# Patient Record
Sex: Male | Born: 1995 | Race: White | Hispanic: No | Marital: Married | Smoking: Current some day smoker
Health system: Southern US, Community
[De-identification: ages and names within clinical notes are randomized; demographics above are authoritative.]

## PROBLEM LIST (undated history)

## (undated) DIAGNOSIS — F319 Bipolar disorder, unspecified: Secondary | ICD-10-CM

## (undated) DIAGNOSIS — F419 Anxiety disorder, unspecified: Secondary | ICD-10-CM

## (undated) DIAGNOSIS — F909 Attention-deficit hyperactivity disorder, unspecified type: Secondary | ICD-10-CM

## (undated) HISTORY — DX: Bipolar disorder, unspecified: F31.9

## (undated) HISTORY — PX: OTHER SURGICAL HISTORY: SHX169

## (undated) HISTORY — PX: TONSILLECTOMY: SUR1361

## (undated) HISTORY — DX: Attention-deficit hyperactivity disorder, unspecified type: F90.9

## (undated) HISTORY — DX: Anxiety disorder, unspecified: F41.9

## (undated) HISTORY — PX: CIRCUMCISION: SUR203

---
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
## 2014-05-16 ENCOUNTER — Encounter (HOSPITAL_COMMUNITY): Payer: Self-pay | Admitting: *Deleted

## 2014-05-16 ENCOUNTER — Emergency Department (HOSPITAL_COMMUNITY)
Admission: EM | Admit: 2014-05-16 | Discharge: 2014-05-16 | Disposition: A | Discharge status: Home / Self Care | Payer: BLUE CROSS/BLUE SHIELD | Attending: Emergency Medicine | Admitting: Emergency Medicine

## 2014-05-16 DIAGNOSIS — Z720 Tobacco use: Secondary | ICD-10-CM | POA: Diagnosis not present

## 2014-05-16 DIAGNOSIS — N4889 Other specified disorders of penis: Secondary | ICD-10-CM | POA: Diagnosis present

## 2014-05-16 DIAGNOSIS — F1410 Cocaine abuse, uncomplicated: Secondary | ICD-10-CM | POA: Diagnosis not present

## 2014-05-16 NOTE — ED Provider Notes (Signed)
CSN: [id number]     Arrival date & time 05/16/14  0225 History   First MD Initiated Contact with Patient 05/16/14 0340     Chief Complaint  Patient presents with  . penile issues      (Consider location/radiation/quality/duration/timing/severity/associated sxs/prior Treatment) The history is provided by the patient.   19 year old male states that he used cocaine tonight and shortly after that, noted swelling in his penis. He states that it was our card and there was some discharge. It was not painful but stated that it felt weird. He states it was not a normal direction. It has improved substantially but he still feels somewhat swollen. He denies any trauma. He has used cocaine before without a similar reaction.  History reviewed. No pertinent past medical history. Past Surgical History  Procedure Laterality Date  . Tonsillectomy    . Arm surgery Right   . Circumcision     History reviewed. No pertinent family history. History  Substance Use Topics  . Smoking status: Current Some Day Smoker -- 0.50 packs/day  . Smokeless tobacco: Not on file  . Alcohol Use: No    Review of Systems  All other systems reviewed and are negative.     Allergies  Review of patient's allergies indicates no known allergies.  Home Medications   Prior to Admission medications   Not on File   BP 132/74 mmHg  Pulse 119  Temp(Src) 98.5 F (36.9 C) (Oral)  Resp 20  Ht 6' (1.829 m)  Wt 145 lb (65.772 kg)  BMI 19.66 kg/m2  SpO2 100% Physical Exam  Nursing note and vitals reviewed.  19 year old male, resting comfortably and in no acute distress. Vital signs are significant for tachycardia. Oxygen saturation is 100%, which is normal. Head is normocephalic and atraumatic. PERRLA, EOMI. Oropharynx is clear. Neck is nontender and supple without adenopathy or JVD. Back is nontender and there is no CVA tenderness. Lungs are clear without rales, wheezes, or rhonchi. Chest is nontender. Heart has  regular rate and rhythm without murmur. Abdomen is soft, flat, nontender without masses or hepatosplenomegaly and peristalsis is normoactive. Genitalia: Circumcised penis which does not appear or act. Testes are descended without masses. There is shotty inguinal adenopathy bilaterally. No urethral discharges seen. Extremities have no cyanosis or edema, full range of motion is present. Skin is warm and dry without rash. Neurologic: Mental status is normal, cranial nerves are intact, there are no motor or sensory deficits.  ED Course  Procedures (including critical care time)  MDM   Final diagnoses:  Cocaine abuse    Probable abnormal erection which has resolved. Cocaine abuse. Patient is reassured and is referred to urology for follow-up. Encouraged to abstain from cocaine.    Dione Boozeavid Makaiya Geerdes, MD 05/16/14 [phone]

## 2014-05-16 NOTE — ED Notes (Signed)
Pt states he noticed his penis was hard x 1 hour; pt states it feels weird

## 2014-05-16 NOTE — Discharge Instructions (Signed)
Do Not Use Cocaine!!

## 2014-05-16 NOTE — ED Notes (Signed)
Pt states he did cocaine this evening approximately 2 hrs pta

## 2016-11-25 ENCOUNTER — Emergency Department (HOSPITAL_COMMUNITY)
Admission: EM | Admit: 2016-11-25 | Discharge: 2016-11-25 | Disposition: A | Discharge status: Home / Self Care | Payer: BLUE CROSS/BLUE SHIELD | Attending: Emergency Medicine | Admitting: Emergency Medicine

## 2016-11-25 ENCOUNTER — Encounter (HOSPITAL_COMMUNITY): Payer: Self-pay | Admitting: *Deleted

## 2016-11-25 DIAGNOSIS — M79601 Pain in right arm: Secondary | ICD-10-CM | POA: Insufficient documentation

## 2016-11-25 DIAGNOSIS — Y929 Unspecified place or not applicable: Secondary | ICD-10-CM | POA: Insufficient documentation

## 2016-11-25 DIAGNOSIS — S40021A Contusion of right upper arm, initial encounter: Secondary | ICD-10-CM | POA: Insufficient documentation

## 2016-11-25 DIAGNOSIS — Y9389 Activity, other specified: Secondary | ICD-10-CM | POA: Insufficient documentation

## 2016-11-25 DIAGNOSIS — Y999 Unspecified external cause status: Secondary | ICD-10-CM | POA: Insufficient documentation

## 2016-11-25 DIAGNOSIS — F17200 Nicotine dependence, unspecified, uncomplicated: Secondary | ICD-10-CM | POA: Insufficient documentation

## 2016-11-25 MED ORDER — IBUPROFEN 800 MG PO TABS: 800.0000 mg | ORAL_TABLET | Freq: Three times a day (TID) | ORAL | 0 refills | Status: DC

## 2016-11-25 MED ORDER — IBUPROFEN 800 MG PO TABS
800.0000 mg | ORAL_TABLET | Freq: Once | ORAL | Status: AC
  Administered 2016-11-25: 800 mg via ORAL
  Filled 2016-11-25: qty 1
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
## 2016-11-25 NOTE — Discharge Instructions (Signed)
Ibuprofen every 8 hours as needed, he may use the sling for the next couple of days however I would try to use this arm as much as possible and do gentle stretching of your bicep and tricep muscles.  ER for severe or worsening weakness numbness pain or swelling.
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
## 2022-05-25 ENCOUNTER — Encounter: Payer: Self-pay | Admitting: Internal Medicine

## 2022-05-25 ENCOUNTER — Ambulatory Visit (INDEPENDENT_AMBULATORY_CARE_PROVIDER_SITE_OTHER): Payer: 59 | Admitting: Internal Medicine

## 2022-05-25 VITALS — BP 133/82 | HR 86 | Ht 73.0 in | Wt 213.0 lb

## 2022-05-25 DIAGNOSIS — Z131 Encounter for screening for diabetes mellitus: Secondary | ICD-10-CM | POA: Diagnosis not present

## 2022-05-25 DIAGNOSIS — Z1321 Encounter for screening for nutritional disorder: Secondary | ICD-10-CM

## 2022-05-25 DIAGNOSIS — Z0001 Encounter for general adult medical examination with abnormal findings: Secondary | ICD-10-CM | POA: Diagnosis not present

## 2022-05-25 DIAGNOSIS — Z1159 Encounter for screening for other viral diseases: Secondary | ICD-10-CM

## 2022-05-25 DIAGNOSIS — Z8659 Personal history of other mental and behavioral disorders: Secondary | ICD-10-CM

## 2022-05-25 DIAGNOSIS — G4700 Insomnia, unspecified: Secondary | ICD-10-CM

## 2022-05-25 DIAGNOSIS — Z2821 Immunization not carried out because of patient refusal: Secondary | ICD-10-CM

## 2022-05-25 DIAGNOSIS — Z13220 Encounter for screening for lipoid disorders: Secondary | ICD-10-CM

## 2022-05-25 DIAGNOSIS — Z1329 Encounter for screening for other suspected endocrine disorder: Secondary | ICD-10-CM

## 2022-05-25 DIAGNOSIS — K219 Gastro-esophageal reflux disease without esophagitis: Secondary | ICD-10-CM

## 2022-05-25 DIAGNOSIS — Z114 Encounter for screening for human immunodeficiency virus [HIV]: Secondary | ICD-10-CM

## 2022-05-25 MED ORDER — FAMOTIDINE 40 MG PO TABS: 40.0000 mg | ORAL_TABLET | Freq: Two times a day (BID) | ORAL | 1 refills | Status: DC

## 2022-05-25 NOTE — Patient Instructions (Signed)
It was a pleasure to see you today.  Thank you for giving [place] the opportunity to be involved in your care.  Below is a brief recap of your visit and next steps.  We will plan to see you again in 6 months.  Summary You have established care today. We will check labs and request records from [place] / [place] Follow up in 6 months

## 2022-05-25 NOTE — Progress Notes (Signed)
New Patient Office Visit  Subjective    Patient ID: Shawn Matthews, male    DOB: 1995-12-21  Age: 27 y.o. MRN: [id number]  CC:  Chief Complaint  Patient presents with   Establish Care   HPI Shawn Matthews presents to establish care.  He is a 27 year old male who endorses a past medical history significant for ADHD, insomnia, mental health disorder, and GERD.  He has most recently received primary care in [place], [place].  He has also been followed by psychiatry in [place].  Shawn Matthews reports feeling well today.  He is asymptomatic and his acute concern is requesting a referral to psychiatry to establish care for management of ADHD and mental health.  Acute concerns, chronic medical conditions, and outstanding preventative care items discussed today are individually addressed in A/P below.  Outpatient Encounter Medications as of 05/25/2022  Medication Sig   amphetamine-dextroamphetamine (ADDERALL XR) 20 MG 24 hr capsule Take 20 mg by mouth every morning.   ibuprofen (ADVIL,MOTRIN) 800 MG tablet Take 1 tablet (800 mg total) by mouth 3 (three) times daily.   QUEtiapine (SEROQUEL) 100 MG tablet Take 100 mg by mouth at bedtime.   [DISCONTINUED] famotidine (PEPCID) 40 MG tablet Take 40 mg by mouth 2 (two) times daily.   famotidine (PEPCID) 40 MG tablet Take 1 tablet (40 mg total) by mouth 2 (two) times daily.   No facility-administered encounter medications on file as of 05/25/2022.    History reviewed. No pertinent past medical history.  Past Surgical History:  Procedure Laterality Date   arm surgery Right    CIRCUMCISION     TONSILLECTOMY      History reviewed. No pertinent family history.  Social History   Socioeconomic History   Marital status: Single    Spouse name: Not on file   Number of children: Not on file   Years of education: Not on file   Highest education level: Not on file  Occupational History   Not on file  Tobacco Use   Smoking status: Some Days    Packs/day: 0.50     Types: Cigarettes   Smokeless tobacco: Never  Vaping Use   Vaping Use: Never used  Substance and Sexual Activity   Alcohol use: No   Drug use: Yes    Types: Marijuana   Sexual activity: Yes    Birth control/protection: None  Other Topics Concern   Not on file  Social History Narrative   Not on file   Social Determinants of Health   Financial Resource Strain: Not on file  Food Insecurity: Not on file  Transportation Needs: Not on file  Physical Activity: Not on file  Stress: Not on file  Social Connections: Not on file  Intimate Partner Violence: Not on file   Review of Systems  Constitutional:  Negative for chills and fever.  HENT:  Negative for sore throat.   Respiratory:  Negative for cough and shortness of breath.   Cardiovascular:  Negative for chest pain, palpitations and leg swelling.  Gastrointestinal:  Negative for abdominal pain, blood in stool, constipation, diarrhea, nausea and vomiting.  Genitourinary:  Negative for dysuria and hematuria.  Musculoskeletal:  Negative for myalgias.  Skin:  Negative for itching and rash.  Neurological:  Negative for dizziness and headaches.  Psychiatric/Behavioral:  Negative for depression and suicidal ideas.    Objective    BP 133/82   Pulse 86   Ht '6\' 1"'$  (1.854 m)   Wt 213 lb (  96.6 kg)   SpO2 98%   BMI 28.10 kg/m   Physical Exam Vitals reviewed.  Constitutional:      General: He is not in acute distress.    Appearance: Normal appearance. He is not ill-appearing.  HENT:     Head: Normocephalic and atraumatic.     Right Ear: External ear normal.     Left Ear: External ear normal.     Nose: Nose normal. No congestion or rhinorrhea.     Mouth/Throat:     Mouth: Mucous membranes are moist.     Pharynx: Oropharynx is clear.  Eyes:     General: No scleral icterus.    Extraocular Movements: Extraocular movements intact.     Conjunctiva/sclera: Conjunctivae normal.     Pupils: Pupils are equal, round, and reactive  to light.  Cardiovascular:     Rate and Rhythm: Normal rate and regular rhythm.     Pulses: Normal pulses.     Heart sounds: Normal heart sounds. No murmur heard. Pulmonary:     Effort: Pulmonary effort is normal.     Breath sounds: Normal breath sounds. No wheezing, rhonchi or rales.  Abdominal:     General: Abdomen is flat. Bowel sounds are normal. There is no distension.     Palpations: Abdomen is soft.     Tenderness: There is no abdominal tenderness.  Musculoskeletal:        General: No swelling or deformity. Normal range of motion.     Cervical back: Normal range of motion.  Skin:    General: Skin is warm and dry.     Capillary Refill: Capillary refill takes less than 2 seconds.  Neurological:     General: No focal deficit present.     Mental Status: He is alert and oriented to person, place, and time.     Motor: No weakness.  Psychiatric:        Mood and Affect: Mood normal.        Behavior: Behavior normal.        Thought Content: Thought content normal.    Assessment & Plan:   Problem List Items Addressed This Visit       GERD (gastroesophageal reflux disease)    Symptoms are currently well-controlled with Pepcid 40 mg twice daily. -No medication changes today.  Pepcid has been refilled      History of ADHD    Currently prescribed Adderall XR 20 mg daily.  Previously followed by psychiatry in [place].  He has requested a psychiatry referral today -Psychiatry referral placed today      Insomnia    Currently prescribed Seroquel 100 mg nightly.  This has been managed by psychiatry. -No medication changes today.  Psychiatry referral has been placed.      Encounter for general adult medical examination with abnormal findings - Primary    Presenting today to establish care.  Available records and labs been reviewed. -Baseline labs ordered today, including one-time HIV/HCV screenings -Records from his previous providers have been requested -Outstanding vaccines  were declined -We will tentatively plan for follow-up in 6 months      Return in about 6 months (around 11/25/2022).   Johnette Abraham, MD

## 2022-05-25 NOTE — Assessment & Plan Note (Signed)
Currently prescribed Adderall XR 20 mg daily.  Previously followed by psychiatry in [place].  He has requested a psychiatry referral today -Psychiatry referral placed today

## 2022-05-25 NOTE — Assessment & Plan Note (Signed)
Symptoms are currently well-controlled with Pepcid 40 mg twice daily. -No medication changes today.  Pepcid has been refilled

## 2022-05-25 NOTE — Assessment & Plan Note (Signed)
Presenting today to establish care.  Available records and labs been reviewed. -Baseline labs ordered today, including one-time HIV/HCV screenings -Records from his previous providers have been requested -Outstanding vaccines were declined -We will tentatively plan for follow-up in 6 months

## 2022-05-25 NOTE — Assessment & Plan Note (Signed)
Currently prescribed Seroquel 100 mg nightly.  This has been managed by psychiatry. -No medication changes today.  Psychiatry referral has been placed.

## 2022-05-26 LAB — CMP14+EGFR
ALT: 42 IU/L (ref 0–44)
AST: 24 IU/L (ref 0–40)
Albumin/Globulin Ratio: 2 (ref 1.2–2.2)
Albumin: 4.8 g/dL (ref 4.3–5.2)
Alkaline Phosphatase: 97 IU/L (ref 44–121)
BUN/Creatinine Ratio: 21 — ABNORMAL HIGH (ref 9–20)
BUN: 22 mg/dL — ABNORMAL HIGH (ref 6–20)
Bilirubin Total: 0.3 mg/dL (ref 0.0–1.2)
CO2: 23 mmol/L (ref 20–29)
Calcium: 9.7 mg/dL (ref 8.7–10.2)
Chloride: 106 mmol/L (ref 96–106)
Creatinine, Ser: 1.03 mg/dL (ref 0.76–1.27)
Globulin, Total: 2.4 g/dL (ref 1.5–4.5)
Glucose: 89 mg/dL (ref 70–99)
Potassium: 4.2 mmol/L (ref 3.5–5.2)
Sodium: 141 mmol/L (ref 134–144)
Total Protein: 7.2 g/dL (ref 6.0–8.5)
eGFR: 101 mL/min/{1.73_m2} (ref >59)

## 2022-05-26 LAB — CBC WITH DIFFERENTIAL/PLATELET
Basophils Absolute: 0 10*3/uL (ref 0.0–0.2)
Basos: 1 %
EOS (ABSOLUTE): 0.1 10*3/uL (ref 0.0–0.4)
Eos: 2 %
Hematocrit: 44 % (ref 37.5–51.0)
Hemoglobin: 14.8 g/dL (ref 13.0–17.7)
Immature Grans (Abs): 0 10*3/uL (ref 0.0–0.1)
Immature Granulocytes: 0 %
Lymphocytes Absolute: 1.8 10*3/uL (ref 0.7–3.1)
Lymphs: 30 %
MCH: 29.6 pg (ref 26.6–33.0)
MCHC: 33.6 g/dL (ref 31.5–35.7)
MCV: 88 fL (ref 79–97)
Monocytes Absolute: 0.4 10*3/uL (ref 0.1–0.9)
Monocytes: 6 %
Neutrophils Absolute: 3.7 10*3/uL (ref 1.4–7.0)
Neutrophils: 61 %
Platelets: 269 10*3/uL (ref 150–450)
RBC: 5 x10E6/uL (ref 4.14–5.80)
RDW: 12.9 % (ref 11.6–15.4)
WBC: 6 10*3/uL (ref 3.4–10.8)

## 2022-05-26 LAB — HCV INTERPRETATION

## 2022-05-26 LAB — HEMOGLOBIN A1C
Est. average glucose Bld gHb Est-mCnc: 108 mg/dL
Hgb A1c MFr Bld: 5.4 % (ref 4.8–5.6)

## 2022-05-26 LAB — LIPID PANEL
Chol/HDL Ratio: 3.7 ratio (ref 0.0–5.0)
Cholesterol, Total: 200 mg/dL — ABNORMAL HIGH (ref 100–199)
HDL: 54 mg/dL (ref >39)
LDL Chol Calc (NIH): 130 mg/dL — ABNORMAL HIGH (ref 0–99)
Triglycerides: 91 mg/dL (ref 0–149)
VLDL Cholesterol Cal: 16 mg/dL (ref 5–40)

## 2022-05-26 LAB — VITAMIN D 25 HYDROXY (VIT D DEFICIENCY, FRACTURES): Vit D, 25-Hydroxy: 45 ng/mL (ref 30.0–100.0)

## 2022-05-26 LAB — TSH+FREE T4
Free T4: 0.96 ng/dL (ref 0.82–1.77)
TSH: 1.14 u[IU]/mL (ref 0.450–4.500)

## 2022-05-26 LAB — HIV ANTIBODY (ROUTINE TESTING W REFLEX): HIV Screen 4th Generation wRfx: NONREACTIVE

## 2022-05-26 LAB — HCV AB W REFLEX TO QUANT PCR: HCV Ab: NONREACTIVE

## 2022-05-26 LAB — B12 AND FOLATE PANEL
Folate: 16.4 ng/mL (ref >3.0)
Vitamin B-12: 551 pg/mL (ref 232–1245)

## 2022-06-06 ENCOUNTER — Other Ambulatory Visit (HOSPITAL_COMMUNITY): Payer: Self-pay

## 2022-06-06 ENCOUNTER — Other Ambulatory Visit: Payer: Self-pay

## 2022-06-06 ENCOUNTER — Telehealth: Payer: Self-pay | Admitting: Internal Medicine

## 2022-06-06 ENCOUNTER — Other Ambulatory Visit: Payer: Self-pay | Admitting: Internal Medicine

## 2022-06-06 DIAGNOSIS — Z8659 Personal history of other mental and behavioral disorders: Secondary | ICD-10-CM

## 2022-06-06 MED ORDER — AMPHETAMINE-DEXTROAMPHET ER 20 MG PO CP24: 20.0000 mg | ORAL_CAPSULE | Freq: Every morning | ORAL | 0 refills | Status: DC

## 2022-06-06 MED ORDER — AMPHETAMINE-DEXTROAMPHET ER 20 MG PO CP24
20.0000 mg | ORAL_CAPSULE | Freq: Every morning | ORAL | 0 refills | Status: DC
  Filled 2022-06-06: qty 30, 30d supply, fill #0

## 2022-06-06 NOTE — Telephone Encounter (Signed)
Pt needs refill on amphetamine-dextroamphetamine (ADDERALL XR) 20 MG 24 hr capsule   Needs med sent to [place], [place], [place].   Pt does not use [facility]

## 2022-06-07 ENCOUNTER — Other Ambulatory Visit: Payer: Self-pay

## 2022-06-07 ENCOUNTER — Other Ambulatory Visit: Payer: Self-pay | Admitting: Internal Medicine

## 2022-06-07 DIAGNOSIS — Z8659 Personal history of other mental and behavioral disorders: Secondary | ICD-10-CM

## 2022-07-02 ENCOUNTER — Telehealth: Payer: Self-pay | Admitting: Internal Medicine

## 2022-07-02 DIAGNOSIS — G4700 Insomnia, unspecified: Secondary | ICD-10-CM

## 2022-07-02 DIAGNOSIS — Z0001 Encounter for general adult medical examination with abnormal findings: Secondary | ICD-10-CM

## 2022-07-02 NOTE — Telephone Encounter (Signed)
QUEtiapine (SEROQUEL) 100 MG tablet pt needs refill [facility] [address]

## 2022-07-04 MED ORDER — QUETIAPINE FUMARATE 100 MG PO TABS: 100.0000 mg | ORAL_TABLET | Freq: Every day | ORAL | 2 refills | Status: DC

## 2022-07-04 NOTE — Addendum Note (Signed)
Addended by: Christel Mormon E on: 07/04/2022 11:04 AM   Modules accepted: Orders

## 2022-07-06 ENCOUNTER — Other Ambulatory Visit: Payer: Self-pay | Admitting: Internal Medicine

## 2022-07-06 DIAGNOSIS — Z8659 Personal history of other mental and behavioral disorders: Secondary | ICD-10-CM

## 2022-07-06 MED ORDER — AMPHETAMINE-DEXTROAMPHET ER 20 MG PO CP24: 20.0000 mg | ORAL_CAPSULE | Freq: Every morning | ORAL | 0 refills | Status: DC

## 2022-07-09 ENCOUNTER — Encounter: Payer: Self-pay | Admitting: Family Medicine

## 2022-07-09 ENCOUNTER — Ambulatory Visit (INDEPENDENT_AMBULATORY_CARE_PROVIDER_SITE_OTHER): Payer: 59 | Admitting: Family Medicine

## 2022-07-09 VITALS — BP 145/83 | HR 87 | Ht 73.0 in | Wt 214.0 lb

## 2022-07-09 DIAGNOSIS — G5601 Carpal tunnel syndrome, right upper limb: Secondary | ICD-10-CM | POA: Insufficient documentation

## 2022-07-09 MED ORDER — PREDNISONE 20 MG PO TABS: 20.0000 mg | ORAL_TABLET | Freq: Two times a day (BID) | ORAL | 0 refills | Status: AC

## 2022-07-09 NOTE — Progress Notes (Signed)
Patient Office Visit   Subjective   Patient ID: Shawn Matthews, male    DOB: 1995-04-21  Age: 27 y.o. MRN: [id number]  CC:  Chief Complaint  Patient presents with   Arm Pain    Pt c/o chronic R arm pain Arm Pain onset last thursday. No injury mechanism. The pain is present constantly in the right elbow and wrist and is described as aching, shooting and burning. The pain radiates to entire arm. Associated symptoms include muscle weakness. Denies numbness or tingling. The symptoms are aggravated by movement and lifting. He has tried NSAIDs and acetaminophen for the symptoms with mild relief.     HPI Shawn Simson Jones27 year old male, presents to the clinic for right hand and elbow pain started 4 days ago. He  has no past medical history on file.  Patient presents to the clinic for evaluation of possible carpal tunnel syndrome pain in the right hand. Onset of the symptoms was 4 days ago.Current symptoms include weakness, pain and swelling involving the above, lateral aspect of the right hand, wrist of fairly severe severity and shooting pain involving the right elbow, of fairly severe severity. Inciting event/aggravating factors: repetitive activity:  work related keyboarding, work related repetitive activity, worse at first thing in the morning, and worse with activity Patient's course of [id number] worsening. Evaluation to date: none.  Treatment to date: ibuprofen 800 mg every 8 hours has avoided aggravating activity for 2 days, which has been somewhat effective and wrist splints used for 3 days, which has been somewhat effective       Outpatient Encounter Medications as of 07/09/2022  Medication Sig   amphetamine-dextroamphetamine (ADDERALL XR) 20 MG 24 hr capsule Take 1 capsule (20 mg total) by mouth every morning.   famotidine (PEPCID) 40 MG tablet Take 1 tablet (40 mg total) by mouth 2 (two) times daily.   ibuprofen (ADVIL,MOTRIN) 800 MG tablet Take 1 tablet (800 mg total) by mouth 3  (three) times daily.   predniSONE (DELTASONE) 20 MG tablet Take 1 tablet (20 mg total) by mouth 2 (two) times daily with breakfast and lunch for 5 days.   QUEtiapine (SEROQUEL) 100 MG tablet Take 1 tablet (100 mg total) by mouth at bedtime.   No facility-administered encounter medications on file as of 07/09/2022.    Past Surgical History:  Procedure Laterality Date   arm surgery Right    CIRCUMCISION     TONSILLECTOMY      Review of Systems  Constitutional:  Negative for chills and fever.  HENT:  Negative for tinnitus.   Eyes:  Negative for blurred vision.  Respiratory:  Negative for shortness of breath.   Cardiovascular:  Negative for chest pain.  Gastrointestinal:  Negative for abdominal pain and vomiting.  Genitourinary:  Negative for dysuria.  Skin:  Negative for rash.  Neurological:  Negative for dizziness and headaches.      Objective    BP (!) 145/83   Pulse 87   Ht  (1.854 m)   Wt 214 lb (97.1 kg)   SpO2 98%   BMI 28.23 kg/m   Physical Exam Vitals reviewed.  Constitutional:      General: He is not in acute distress.    Appearance: Normal appearance. He is not ill-appearing, toxic-appearing or diaphoretic.  HENT:     Head: Normocephalic.  Eyes:     General:        Right eye: No discharge.  Left eye: No discharge.     Conjunctiva/sclera: Conjunctivae normal.  Cardiovascular:     Rate and Rhythm: Normal rate.     Pulses: Normal pulses.     Heart sounds: Normal heart sounds.  Pulmonary:     Effort: Pulmonary effort is normal. No respiratory distress.     Breath sounds: Normal breath sounds.  Musculoskeletal:        General: Tenderness present.     Right elbow: Decreased range of motion.     Left elbow: Normal.     Right wrist: Swelling present. Decreased range of motion.     Left wrist: Normal.     Right hand: Swelling present. Decreased range of motion. Decreased strength.     Left hand: Normal.     Cervical back: Normal range of motion.   Skin:    General: Skin is warm and dry.     Capillary Refill: Capillary refill takes less than 2 seconds.  Neurological:     General: No focal deficit present.     Mental Status: He is alert and oriented to person, place, and time.     Coordination: Coordination normal.     Gait: Gait normal.  Psychiatric:        Mood and Affect: Mood normal.        Behavior: Behavior normal.       Assessment & Plan:  Carpal tunnel syndrome of right wrist -     predniSONE; Take 1 tablet (20 mg total) by mouth 2 (two) times daily with breakfast and lunch for 5 days.  Dispense: 10 tablet; Refill: 0 -     Ambulatory referral to Orthopedics  Carpal tunnel syndrome on right Assessment & Plan: Right Shooting pain felt on pressure over median nerve Positive Phalen test Prednisone 20 mg x 5 days Apply wrist splint daily Referral placed  to orthopedics      No follow-ups on file.   Cruzita Lederer Newman Nip, FNP

## 2022-07-09 NOTE — Assessment & Plan Note (Signed)
Right Shooting pain felt on pressure over median nerve Positive Phalen test Prednisone 20 mg x 5 days Apply wrist splint daily Referral placed  to orthopedics

## 2022-07-09 NOTE — Patient Instructions (Signed)
It was pleasure meeting with you today. Please take medications as prescribed. Follow up with your primary health provider if any health concerns arises. If symptoms worsen please contact your primary care provider and/or visit the emergency department.  

## 2022-07-11 ENCOUNTER — Ambulatory Visit: Payer: 59 | Admitting: Orthopedic Surgery

## 2022-07-11 ENCOUNTER — Encounter: Payer: Self-pay | Admitting: Orthopedic Surgery

## 2022-07-11 VITALS — BP 154/92 | HR 81 | Ht 73.0 in | Wt 214.0 lb

## 2022-07-11 DIAGNOSIS — R202 Paresthesia of skin: Secondary | ICD-10-CM

## 2022-07-11 DIAGNOSIS — M79609 Pain in unspecified limb: Secondary | ICD-10-CM

## 2022-07-11 MED ORDER — GABAPENTIN 100 MG PO CAPS: 100.0000 mg | ORAL_CAPSULE | Freq: Three times a day (TID) | ORAL | 0 refills | Status: DC

## 2022-07-11 NOTE — Patient Instructions (Signed)
Continue prednisone to completion  Once prednisone is complete, you can take ibuprofen as needed  Provided prescription for gabapentin  We will refer you to Dr. Alvester Morin for nerve study tests.  Once these tests are done, please return to clinic and we can discuss the findings.
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
## 2022-07-17 ENCOUNTER — Ambulatory Visit (INDEPENDENT_AMBULATORY_CARE_PROVIDER_SITE_OTHER): Payer: 59 | Admitting: Physical Medicine and Rehabilitation

## 2022-07-17 DIAGNOSIS — R202 Paresthesia of skin: Secondary | ICD-10-CM

## 2022-07-17 DIAGNOSIS — M79601 Pain in right arm: Secondary | ICD-10-CM | POA: Diagnosis not present

## 2022-07-17 DIAGNOSIS — M25521 Pain in right elbow: Secondary | ICD-10-CM

## 2022-07-17 NOTE — Progress Notes (Signed)
Shawn Matthews - 27 y.o. male MRN [id number]  Date of birth: 1995/07/05  Office Visit Note: Visit Date: 07/17/2022 PCP: Billie Lade, MD Referred by: Oliver Barre, MD  Subjective: No chief complaint on file.  HPI:  Shawn Matthews is a 27 y.o. male who comes in today at the request of Dr. Thane Edu for evaluation and management of chronic, worsening and severe pain, numbness and tingling in the right upper extremities.  Patient is left hand dominant.  Chronic but somewhat recent worsening of right arm pain relief from the dorsum of the hand to the elbow with sensitivity of the right elbow on the lateral aspect.  He rates his pain as a 4 out of 10 moderate scale with some impairment with activities of daily living.  His history is complicated and that he had a ORIF of the right humerus some 10 years ago.  More recently he has started having increased but somewhat random times where he gets swelling and shooting pains to the top of his hand into his arm.  Sensitivity increase over the lateral epicondyle but no pain on exam to pressure and palpation over the epicondyles.  He reports some symptoms ever since the surgery but again more recent worsening without specific injury.  Symptoms are nondermatomal.  No frank radicular pain down the arm.  No left-sided complaints.  No history of prior electrodiagnostic study.  I spent more than 30 minutes speaking face-to-face with the patient with 50% of the time in counseling and discussing coordination of care.    Review of Systems  Musculoskeletal:  Positive for joint pain.  Neurological:  Positive for tingling.  All other systems reviewed and are negative.  Otherwise per HPI.  Assessment & Plan: Visit Diagnoses:    ICD-10-CM   1. Paresthesia of skin  R20.2 NCV with EMG (electromyography)    2. Pain in right elbow  M25.521     3. Right arm pain  M79.601       Plan: Impression: Clinically complicated picture of recent worsening of right arm  pain with sensitivity over the elbow.  This is in the setting of prior fracture many years ago.  Symptoms somewhat nondermatomal and somewhat like epicondylitis but again clinically not matching up.  Electrodiagnostic study performed.  Essentially NORMAL electrodiagnostic study of the right upper limb.  There is no significant electrodiagnostic evidence of nerve entrapment, brachial plexopathy or cervical radiculopathy.    As you know, purely sensory or demyelinating radiculopathies and chemical radiculitis may not be detected with this particular electrodiagnostic study.  Recommendations: 1.  Follow-up with referring physician.  2.  Continue current management of symptoms.  Consider diagnostic imaging of the elbow.  Meds & Orders: No orders of the defined types were placed in this encounter.   Orders Placed This Encounter  Procedures   NCV with EMG (electromyography)    Follow-up: Return for Thane Edu, MD.   Procedures: No procedures performed  EMG & NCV Findings: All nerve conduction studies (as indicated in the following tables) were within normal limits.    All examined muscles (as indicated in the following table) showed no evidence of electrical instability.    Impression: Essentially NORMAL electrodiagnostic study of the right upper limb.  There is no significant electrodiagnostic evidence of nerve entrapment, brachial plexopathy or cervical radiculopathy.    As you know, purely sensory or demyelinating radiculopathies and chemical radiculitis may not be detected with this particular electrodiagnostic study.  Recommendations: 1.  Follow-up with referring physician.  2.  Continue current management of symptoms.  Consider diagnostic imaging of the elbow.  ___________________________ Naaman Plummer FAAPMR Board Certified, American Board of Physical Medicine and Rehabilitation    Nerve Conduction Studies Anti Sensory Summary Table   Stim Site NR Peak (ms) Norm Peak (ms) P-T  Amp (V) Norm P-T Amp Site1 Site2 Delta-P (ms) Dist (cm) Vel (m/s) Norm Vel (m/s)  Right Median Acr Palm Anti Sensory (2nd Digit)  30.8C  Wrist    3.1 <3.6 26.9 >10 Wrist Palm 1.3 0.0    Palm    1.8 <2.0 38.6         Right Radial Anti Sensory (Base 1st Digit)  30.4C  Wrist    2.1 <3.1 21.7  Wrist Base 1st Digit 2.1 0.0    Right Ulnar Anti Sensory (5th Digit)  30.9C  Wrist    3.1 <3.7 33.8 >15.0 Wrist 5th Digit 3.1 14.0 45 >38   Motor Summary Table   Stim Site NR Onset (ms) Norm Onset (ms) O-P Amp (mV) Norm O-P Amp Site1 Site2 Delta-0 (ms) Dist (cm) Vel (m/s) Norm Vel (m/s)  Right Median Motor (Abd Poll Brev)  30.7C  Wrist    3.2 <4.2 8.5 >5 Elbow Wrist 4.0 22.0 55 >50  Elbow    7.2  8.4         Right Radial Motor (Ext Indicis)  31.2C  8cm    2.4 <2.5 2.5 >1.7 Up Arm 8cm 3.3 20.5 62 >60  Up Arm    5.7  1.8         Right Ulnar Motor (Abd Dig Min)  30.8C  Wrist    3.4 <4.2 10.8 >3 B Elbow Wrist 3.0 22.0 73 >53  B Elbow    6.4  11.1  A Elbow B Elbow 1.3 9.0 69 >53  A Elbow    7.7  11.2          EMG   Side Muscle Nerve Root Ins Act Fibs Psw Amp Dur Poly Recrt Int Dennie Bible Comment  Right Abd Poll Brev Median C8-T1 Nml Nml Nml Nml Nml 0 Nml Nml   Right 1stDorInt Ulnar C8-T1 Nml Nml Nml Nml Nml 0 Nml Nml   Right PronatorTeres Median C6-7 Nml Nml Nml Nml Nml 0 Nml Nml   Right Biceps Musculocut C5-6 Nml Nml Nml Nml Nml 0 Nml Nml   Right Deltoid Axillary C5-6 Nml Nml Nml Nml Nml 0 Nml Nml     Nerve Conduction Studies Anti Sensory Left/Right Comparison   Stim Site L Lat (ms) R Lat (ms) L-R Lat (ms) L Amp (V) R Amp (V) L-R Amp (%) Site1 Site2 L Vel (m/s) R Vel (m/s) L-R Vel (m/s)  Median Acr Palm Anti Sensory (2nd Digit)  30.8C  Wrist  3.1   26.9  Wrist Palm     Palm  1.8   38.6        Radial Anti Sensory (Base 1st Digit)  30.4C  Wrist  2.1   21.7  Wrist Base 1st Digit     Ulnar Anti Sensory (5th Digit)  30.9C  Wrist  3.1   33.8  Wrist 5th Digit  45    Motor Left/Right  Comparison   Stim Site L Lat (ms) R Lat (ms) L-R Lat (ms) L Amp (mV) R Amp (mV) L-R Amp (%) Site1 Site2 L Vel (m/s) R Vel (m/s) L-R Vel (m/s)  Median Motor (Abd Poll Brev)  30.7C  Wrist  3.2   8.5  Elbow Wrist  55   Elbow  7.2   8.4        Radial Motor (Ext Indicis)  31.2C  8cm  2.4   2.5  Up Arm 8cm  62   Up Arm  5.7   1.8        Ulnar Motor (Abd Dig Min)  30.8C  Wrist  3.4   10.8  B Elbow Wrist  73   B Elbow  6.4   11.1  A Elbow B Elbow  69   A Elbow  7.7   11.2           Waveforms:              Clinical History: No specialty comments available.     Objective:  VS:  HT:    WT:   BMI:     BP:   HR: bpm  TEMP: ( )  RESP:  Physical Exam Vitals and nursing note reviewed.  Constitutional:      General: He is not in acute distress.    Appearance: Normal appearance. He is well-developed.  HENT:     Head: Normocephalic and atraumatic.  Eyes:     Conjunctiva/sclera: Conjunctivae normal.     Pupils: Pupils are equal, round, and reactive to light.  Cardiovascular:     Rate and Rhythm: Normal rate.     Pulses: Normal pulses.     Heart sounds: Normal heart sounds.  Pulmonary:     Effort: Pulmonary effort is normal. No respiratory distress.  Musculoskeletal:        General: No tenderness.     Cervical back: Normal range of motion and neck supple. No rigidity.     Right lower leg: No edema.     Left lower leg: No edema.     Comments: Inspection reveals well-healed surgical scar of the right humerus but no atrophy of the bilateral APB or FDI or hand intrinsics. There is no swelling, color changes, allodynia or dystrophic changes. There is 5 out of 5 strength in the bilateral wrist extension, finger abduction and long finger flexion. There is intact sensation to light touch in all dermatomal and peripheral nerve distributions. There is a negative Froment's test bilaterally. There is a negative Tinel's test at the bilateral wrist and elbow. There is a negative Phalen's test  bilaterally. There is a negative Hoffmann's test bilaterally.  No real pain to palpation over the epicondyles.  Skin:    General: Skin is warm and dry.     Findings: No erythema or rash.  Neurological:     General: No focal deficit present.     Mental Status: He is alert and oriented to person, place, and time.     Sensory: No sensory deficit.     Motor: No weakness or abnormal muscle tone.     Coordination: Coordination normal.     Gait: Gait normal.  Psychiatric:        Mood and Affect: Mood normal.        Behavior: Behavior normal.        Thought Content: Thought content normal.      Imaging: No results found.

## 2022-07-17 NOTE — Progress Notes (Signed)
Functional Pain Scale - descriptive words and definitions  Moderate (4)   Constantly aware of pain, can complete ADLs with modification/sleep marginally affected at times/passive distraction is of no use, but active distraction gives some relief. Moderate range order  Average Pain  varies  Left handed. Right arm pain and burning from top of the hand to the elbow. Sensitivity in right elbow

## 2022-07-18 NOTE — Procedures (Signed)
EMG & NCV Findings: All nerve conduction studies (as indicated in the following tables) were within normal limits.    All examined muscles (as indicated in the following table) showed no evidence of electrical instability.    Impression: Essentially NORMAL electrodiagnostic study of the right upper limb.  There is no significant electrodiagnostic evidence of nerve entrapment, brachial plexopathy or cervical radiculopathy.    As you know, purely sensory or demyelinating radiculopathies and chemical radiculitis may not be detected with this particular electrodiagnostic study.  Recommendations: 1.  Follow-up with referring physician.  2.  Continue current management of symptoms.  Consider diagnostic imaging of the elbow.  ___________________________ Naaman Plummer FAAPMR Board Certified, American Board of Physical Medicine and Rehabilitation    Nerve Conduction Studies Anti Sensory Summary Table   Stim Site NR Peak (ms) Norm Peak (ms) P-T Amp (V) Norm P-T Amp Site1 Site2 Delta-P (ms) Dist (cm) Vel (m/s) Norm Vel (m/s)  Right Median Acr Palm Anti Sensory (2nd Digit)  30.8C  Wrist    3.1 <3.6 26.9 >10 Wrist Palm 1.3 0.0    Palm    1.8 <2.0 38.6         Right Radial Anti Sensory (Base 1st Digit)  30.4C  Wrist    2.1 <3.1 21.7  Wrist Base 1st Digit 2.1 0.0    Right Ulnar Anti Sensory (5th Digit)  30.9C  Wrist    3.1 <3.7 33.8 >15.0 Wrist 5th Digit 3.1 14.0 45 >38   Motor Summary Table   Stim Site NR Onset (ms) Norm Onset (ms) O-P Amp (mV) Norm O-P Amp Site1 Site2 Delta-0 (ms) Dist (cm) Vel (m/s) Norm Vel (m/s)  Right Median Motor (Abd Poll Brev)  30.7C  Wrist    3.2 <4.2 8.5 >5 Elbow Wrist 4.0 22.0 55 >50  Elbow    7.2  8.4         Right Radial Motor (Ext Indicis)  31.2C  8cm    2.4 <2.5 2.5 >1.7 Up Arm 8cm 3.3 20.5 62 >60  Up Arm    5.7  1.8         Right Ulnar Motor (Abd Dig Min)  30.8C  Wrist    3.4 <4.2 10.8 >3 B Elbow Wrist 3.0 22.0 73 >53  B Elbow    6.4  11.1  A Elbow B  Elbow 1.3 9.0 69 >53  A Elbow    7.7  11.2          EMG   Side Muscle Nerve Root Ins Act Fibs Psw Amp Dur Poly Recrt Int Dennie Bible Comment  Right Abd Poll Brev Median C8-T1 Nml Nml Nml Nml Nml 0 Nml Nml   Right 1stDorInt Ulnar C8-T1 Nml Nml Nml Nml Nml 0 Nml Nml   Right PronatorTeres Median C6-7 Nml Nml Nml Nml Nml 0 Nml Nml   Right Biceps Musculocut C5-6 Nml Nml Nml Nml Nml 0 Nml Nml   Right Deltoid Axillary C5-6 Nml Nml Nml Nml Nml 0 Nml Nml     Nerve Conduction Studies Anti Sensory Left/Right Comparison   Stim Site L Lat (ms) R Lat (ms) L-R Lat (ms) L Amp (V) R Amp (V) L-R Amp (%) Site1 Site2 L Vel (m/s) R Vel (m/s) L-R Vel (m/s)  Median Acr Palm Anti Sensory (2nd Digit)  30.8C  Wrist  3.1   26.9  Wrist Palm     Palm  1.8   38.6        Radial Anti Sensory (  Base 1st Digit)  30.4C  Wrist  2.1   21.7  Wrist Base 1st Digit     Ulnar Anti Sensory (5th Digit)  30.9C  Wrist  3.1   33.8  Wrist 5th Digit  45    Motor Left/Right Comparison   Stim Site L Lat (ms) R Lat (ms) L-R Lat (ms) L Amp (mV) R Amp (mV) L-R Amp (%) Site1 Site2 L Vel (m/s) R Vel (m/s) L-R Vel (m/s)  Median Motor (Abd Poll Brev)  30.7C  Wrist  3.2   8.5  Elbow Wrist  55   Elbow  7.2   8.4        Radial Motor (Ext Indicis)  31.2C  8cm  2.4   2.5  Up Arm 8cm  62   Up Arm  5.7   1.8        Ulnar Motor (Abd Dig Min)  30.8C  Wrist  3.4   10.8  B Elbow Wrist  73   B Elbow  6.4   11.1  A Elbow B Elbow  69   A Elbow  7.7   11.2           Waveforms:

## 2022-07-26 ENCOUNTER — Encounter: Payer: Self-pay | Admitting: Physical Medicine and Rehabilitation

## 2022-08-02 ENCOUNTER — Other Ambulatory Visit: Payer: Self-pay | Admitting: Internal Medicine

## 2022-08-02 DIAGNOSIS — Z8659 Personal history of other mental and behavioral disorders: Secondary | ICD-10-CM

## 2022-08-02 MED ORDER — AMPHETAMINE-DEXTROAMPHET ER 20 MG PO CP24: 20.0000 mg | ORAL_CAPSULE | Freq: Every morning | ORAL | 0 refills | Status: DC

## 2022-09-01 ENCOUNTER — Other Ambulatory Visit: Payer: Self-pay | Admitting: Internal Medicine

## 2022-09-01 DIAGNOSIS — Z8659 Personal history of other mental and behavioral disorders: Secondary | ICD-10-CM

## 2022-09-03 ENCOUNTER — Other Ambulatory Visit: Payer: Self-pay | Admitting: Internal Medicine

## 2022-09-03 DIAGNOSIS — Z8659 Personal history of other mental and behavioral disorders: Secondary | ICD-10-CM

## 2022-09-03 MED ORDER — AMPHETAMINE-DEXTROAMPHET ER 20 MG PO CP24: 20.0000 mg | ORAL_CAPSULE | Freq: Every morning | ORAL | 0 refills | Status: DC

## 2022-10-02 ENCOUNTER — Other Ambulatory Visit: Payer: Self-pay | Admitting: Internal Medicine

## 2022-10-02 DIAGNOSIS — Z8659 Personal history of other mental and behavioral disorders: Secondary | ICD-10-CM

## 2022-10-02 MED ORDER — AMPHETAMINE-DEXTROAMPHET ER 20 MG PO CP24: 20.0000 mg | ORAL_CAPSULE | Freq: Every morning | ORAL | 0 refills | Status: DC

## 2022-10-31 ENCOUNTER — Other Ambulatory Visit: Payer: Self-pay | Admitting: Internal Medicine

## 2022-10-31 DIAGNOSIS — Z8659 Personal history of other mental and behavioral disorders: Secondary | ICD-10-CM

## 2022-10-31 MED ORDER — AMPHETAMINE-DEXTROAMPHET ER 20 MG PO CP24: 20.0000 mg | ORAL_CAPSULE | Freq: Every morning | ORAL | 0 refills | Status: DC

## 2022-11-29 ENCOUNTER — Other Ambulatory Visit: Payer: Self-pay | Admitting: Internal Medicine

## 2022-11-29 DIAGNOSIS — Z8659 Personal history of other mental and behavioral disorders: Secondary | ICD-10-CM

## 2022-11-29 MED ORDER — AMPHETAMINE-DEXTROAMPHET ER 20 MG PO CP24: 20.0000 mg | ORAL_CAPSULE | Freq: Every morning | ORAL | 0 refills | Status: DC

## 2022-12-07 ENCOUNTER — Encounter: Payer: Self-pay | Admitting: Internal Medicine

## 2022-12-07 ENCOUNTER — Ambulatory Visit: Payer: 59 | Admitting: Internal Medicine

## 2022-12-07 VITALS — BP 132/88 | HR 98 | Ht 72.0 in | Wt 202.4 lb

## 2022-12-07 DIAGNOSIS — L2082 Flexural eczema: Secondary | ICD-10-CM

## 2022-12-07 DIAGNOSIS — G4700 Insomnia, unspecified: Secondary | ICD-10-CM | POA: Diagnosis not present

## 2022-12-07 DIAGNOSIS — K219 Gastro-esophageal reflux disease without esophagitis: Secondary | ICD-10-CM

## 2022-12-07 DIAGNOSIS — K824 Cholesterolosis of gallbladder: Secondary | ICD-10-CM | POA: Diagnosis not present

## 2022-12-07 DIAGNOSIS — Z8659 Personal history of other mental and behavioral disorders: Secondary | ICD-10-CM

## 2022-12-07 DIAGNOSIS — Z2821 Immunization not carried out because of patient refusal: Secondary | ICD-10-CM

## 2022-12-07 MED ORDER — QUETIAPINE FUMARATE 100 MG PO TABS: 100.0000 mg | ORAL_TABLET | Freq: Every day | ORAL | 1 refills | Status: DC

## 2022-12-07 MED ORDER — TRIAMCINOLONE ACETONIDE 0.1 % EX CREA: 1.0000 | TOPICAL_CREAM | Freq: Every day | CUTANEOUS | 1 refills | Status: DC | PRN

## 2022-12-07 MED ORDER — FAMOTIDINE 40 MG PO TABS: 40.0000 mg | ORAL_TABLET | Freq: Two times a day (BID) | ORAL | 1 refills | Status: DC

## 2022-12-07 NOTE — Patient Instructions (Signed)
It was a pleasure to see you today.  Thank you for giving [place] the opportunity to be involved in your care.  Below is a brief recap of your visit and next steps.  We will plan to see you again in 6 months.  Summary New psychiatry referral placed Kenalog cream prescribed for Eczema RUQ ultrasound ordered for evaluation of gallbladder polyps. Follow up in 6 months

## 2022-12-11 ENCOUNTER — Ambulatory Visit (HOSPITAL_COMMUNITY): Payer: 59

## 2022-12-17 DIAGNOSIS — K824 Cholesterolosis of gallbladder: Secondary | ICD-10-CM | POA: Insufficient documentation

## 2022-12-17 DIAGNOSIS — L309 Dermatitis, unspecified: Secondary | ICD-10-CM | POA: Insufficient documentation

## 2022-12-17 NOTE — Assessment & Plan Note (Signed)
Currently prescribed Adderall XR to 20 mg daily for management of ADHD.  He was followed by psychiatry in [place] before moving to [place].  He was referred to psychiatry at his last appointment but was told that no medications would be refilled if he could not provide records reflecting previous ADHD testing or was unwilling to undergo repeat testing for ADHD. -New psychiatry referral placed at patient's request

## 2022-12-17 NOTE — Assessment & Plan Note (Signed)
His additional concern today is that 3 gallbladder polyps 3 mm in size were noted on imaging obtained 3 years ago.  Repeat imaging requested. -Repeat RUQ U/S ordered today

## 2022-12-17 NOTE — Assessment & Plan Note (Signed)
Symptoms remain well-controlled with Pepcid.

## 2022-12-17 NOTE — Assessment & Plan Note (Signed)
Currently applying Exoderm cream for management of eczema but there is an eczematous rash on his right forearm that has not completely resolved.  An additional topical agent is requested today. -Triamcinolone cream prescribed today

## 2022-12-25 ENCOUNTER — Ambulatory Visit (HOSPITAL_COMMUNITY)
Admission: RE | Admit: 2022-12-25 | Discharge: 2022-12-25 | Disposition: A | Discharge status: Home / Self Care | Payer: 59 | Source: Ambulatory Visit | Attending: Internal Medicine | Admitting: Internal Medicine

## 2022-12-25 DIAGNOSIS — K824 Cholesterolosis of gallbladder: Secondary | ICD-10-CM | POA: Diagnosis present

## 2022-12-29 ENCOUNTER — Other Ambulatory Visit: Payer: Self-pay | Admitting: Internal Medicine

## 2022-12-29 DIAGNOSIS — Z8659 Personal history of other mental and behavioral disorders: Secondary | ICD-10-CM

## 2022-12-31 MED ORDER — AMPHETAMINE-DEXTROAMPHET ER 20 MG PO CP24
20.0000 mg | ORAL_CAPSULE | Freq: Every morning | ORAL | 0 refills | Status: DC
Start: 2022-12-31 — End: 2023-01-29

## 2023-01-29 ENCOUNTER — Other Ambulatory Visit: Payer: Self-pay | Admitting: Internal Medicine

## 2023-01-29 DIAGNOSIS — Z8659 Personal history of other mental and behavioral disorders: Secondary | ICD-10-CM

## 2023-01-29 MED ORDER — AMPHETAMINE-DEXTROAMPHET ER 20 MG PO CP24
20.0000 mg | ORAL_CAPSULE | Freq: Every morning | ORAL | 0 refills | Status: DC
Start: 2023-01-29 — End: 2023-02-27

## 2023-01-29 NOTE — Telephone Encounter (Signed)
Last office visit 12/07/22 to follow up in 6 months Upcoming appointment 06/07/23  Last refill 12/31/22 #30, no refills

## 2023-02-27 ENCOUNTER — Other Ambulatory Visit: Payer: Self-pay | Admitting: Internal Medicine

## 2023-02-27 DIAGNOSIS — Z8659 Personal history of other mental and behavioral disorders: Secondary | ICD-10-CM

## 2023-02-27 MED ORDER — AMPHETAMINE-DEXTROAMPHET ER 20 MG PO CP24
20.0000 mg | ORAL_CAPSULE | Freq: Every morning | ORAL | 0 refills | Status: DC
Start: 2023-02-27 — End: 2023-04-01

## 2023-04-01 ENCOUNTER — Other Ambulatory Visit: Payer: Self-pay | Admitting: Internal Medicine

## 2023-04-01 DIAGNOSIS — Z8659 Personal history of other mental and behavioral disorders: Secondary | ICD-10-CM

## 2023-04-02 MED ORDER — AMPHETAMINE-DEXTROAMPHET ER 20 MG PO CP24
20.0000 mg | ORAL_CAPSULE | Freq: Every morning | ORAL | 0 refills | Status: DC
Start: 2023-04-02 — End: 2023-05-02

## 2023-05-02 ENCOUNTER — Other Ambulatory Visit: Payer: Self-pay | Admitting: Internal Medicine

## 2023-05-02 DIAGNOSIS — Z8659 Personal history of other mental and behavioral disorders: Secondary | ICD-10-CM

## 2023-05-02 MED ORDER — AMPHETAMINE-DEXTROAMPHET ER 20 MG PO CP24
20.0000 mg | ORAL_CAPSULE | Freq: Every morning | ORAL | 0 refills | Status: DC
Start: 2023-05-02 — End: 2023-05-31

## 2023-05-07 ENCOUNTER — Ambulatory Visit (HOSPITAL_COMMUNITY): Payer: Self-pay | Admitting: Psychiatry

## 2023-05-31 ENCOUNTER — Other Ambulatory Visit: Payer: Self-pay | Admitting: Internal Medicine

## 2023-05-31 DIAGNOSIS — Z8659 Personal history of other mental and behavioral disorders: Secondary | ICD-10-CM

## 2023-05-31 MED ORDER — AMPHETAMINE-DEXTROAMPHET ER 20 MG PO CP24
20.0000 mg | ORAL_CAPSULE | Freq: Every morning | ORAL | 0 refills | Status: DC
Start: 2023-05-31 — End: 2023-07-02

## 2023-06-03 NOTE — Progress Notes (Deleted)
 Psychiatric Initial Adult Assessment   Patient Identification: Shawn Matthews MRN:  [id number] Date of Evaluation:  06/03/2023 Referral Source: PCP  Chief Complaint:  No chief complaint on file.  Visit Diagnosis: No diagnosis found.   Assessment:  Shawn Matthews is a 28 y.o. male with a history of reported ADHD and insomnia who presents in person to [facility] at [facility] for initial evaluation on 06/03/2023.    At initial evaluation patient reports ***  A number of assessments were performed during the evaluation today including  PHQ-9 which they scored a *** on, GAD-7 which they scored a *** on, and [place] suicide severity screening which showed ***.    Plan: - Adderall extended release 20 mg daily managed by PCP - Seroquel 100 mg nightly - CMP, CBC, lipid panel, folate, vitamin B12, vitamin D, A1c, TSH reviewed  -Due for repeat metabolic labs - Neuropsych testing referral - Crisis resources reviewed - Follow up in  History of Present Illness:  Shawn Matthews presents following referral from his PCP reportedly for management of insomnia.      He requests a referral to a different psychiatrist as the one he was previously referred to requested that he undergo repeat testing for ADHD unless he could provide records of prior ADHD testing.   Currently prescribed Adderall XR to 20 mg daily for management of ADHD.  He was followed by psychiatry in [place] before moving to [place].  He was referred to psychiatry at his last appointment but was told that no medications would be refilled if he could not provide records reflecting previous ADHD testing or was unwilling to undergo repeat testing for ADHD. -New psychiatry referral placed at patient's request  Associated Signs/Symptoms: Depression Symptoms:  {DEPRESSION SYMPTOMS:20000} (Hypo) Manic Symptoms:  {BHH MANIC SYMPTOMS:22872} Anxiety Symptoms:  {BHH ANXIETY SYMPTOMS:22873} Psychotic Symptoms:  {BHH  PSYCHOTIC SYMPTOMS:22874} PTSD Symptoms: {BHH PTSD YQMVHQIO:96295}  Past Psychiatric History: ***  Medication trials: Adderall XR 20 mg daily  Substance use: Cocaine use  Previous Psychotropic Medications: Yes   Substance Abuse History in the last 12 months:  {yes no:314532}  Consequences of Substance Abuse: {BHH CONSEQUENCES OF SUBSTANCE ABUSE:22880}  Past Medical History: No past medical history on file.  Past Surgical History:  Procedure Laterality Date   arm surgery Right    CIRCUMCISION     TONSILLECTOMY      Family Psychiatric History: ***  Family History: No family history on file.  Social History:   Social History   Socioeconomic History   Marital status: Single    Spouse name: Not on file   Number of children: Not on file   Years of education: Not on file   Highest education level: Not on file  Occupational History   Not on file  Tobacco Use   Smoking status: Some Days    Current packs/day: 0.50    Types: Cigarettes   Smokeless tobacco: Never  Vaping Use   Vaping status: Never Used  Substance and Sexual Activity   Alcohol use: No   Drug use: Yes    Types: Marijuana   Sexual activity: Yes    Birth control/protection: None  Other Topics Concern   Not on file  Social History Narrative   Not on file   Social Drivers of Health   Financial Resource Strain: Not on file  Food Insecurity: Not on file  Transportation Needs: Not on file  Physical Activity: Not on file  Stress: Not on file  Social Connections: Not on file    Additional Social History: ***  Allergies:   Allergies  Allergen Reactions   Other     Bees    Metabolic Disorder Labs: Lab Results  Component Value Date   HGBA1C 5.4 05/25/2022   No results found for: "PROLACTIN" Lab Results  Component Value Date   CHOL 200 (H) 05/25/2022   TRIG 91 05/25/2022   HDL 54 05/25/2022   CHOLHDL 3.7 05/25/2022   LDLCALC 130 (H) 05/25/2022   Lab Results  Component Value Date   TSH  1.140 05/25/2022    Therapeutic Level Labs: No results found for: "LITHIUM" No results found for: "CBMZ" No results found for: "VALPROATE"  Current Medications: Current Outpatient Medications  Medication Sig Dispense Refill   amphetamine-dextroamphetamine (ADDERALL XR) 20 MG 24 hr capsule Take 1 capsule (20 mg total) by mouth every morning. 30 capsule 0   famotidine (PEPCID) 40 MG tablet Take 1 tablet (40 mg total) by mouth 2 (two) times daily. 180 tablet 1   ibuprofen (ADVIL,MOTRIN) 800 MG tablet Take 1 tablet (800 mg total) by mouth 3 (three) times daily. 21 tablet 0   QUEtiapine (SEROQUEL) 100 MG tablet Take 1 tablet (100 mg total) by mouth at bedtime. 90 tablet 1   triamcinolone cream (KENALOG) 0.1 % Apply 1 Application topically daily as needed (eczema flare). 30 g 1   No current facility-administered medications for this visit.    Musculoskeletal: Strength & Muscle Tone: {desc; muscle tone:32375} Gait & Station: {PE GAIT ED ZOXW:96045} Patient leans: {Patient Leans:21022755}  Psychiatric Specialty Exam: Review of Systems  There were no vitals taken for this visit.There is no height or weight on file to calculate BMI.  General Appearance: {Appearance:22683}  Eye Contact:  {BHH EYE CONTACT:22684}  Speech:  {Speech:22685}  Volume:  {Volume (PAA):22686}  Mood:  {BHH MOOD:22306}  Affect:  {Affect (PAA):22687}  Thought Process:  {Thought Process (PAA):22688}  Orientation:  {BHH ORIENTATION (PAA):22689}  Thought Content:  {Thought Content:22690}  Suicidal Thoughts:  {ST/HT (PAA):22692}  Homicidal Thoughts:  {ST/HT (PAA):22692}  Memory:  {BHH MEMORY:22881}  Judgement:  {Judgement (PAA):22694}  Insight:  {Insight (PAA):22695}  Psychomotor Activity:  {Psychomotor (PAA):22696}  Concentration:  {Concentration:21399}  Recall:  {BHH GOOD/FAIR/POOR:22877}  Fund of Knowledge:{BHH GOOD/FAIR/POOR:22877}  Language: {BHH GOOD/FAIR/POOR:22877}  Akathisia:  {BHH YES OR NO:22294}     AIMS (if indicated):  {Desc; done/not:10129}  Assets:  {Assets (PAA):22698}  ADL's:  {BHH WUJ'W:11914}  Cognition: {chl bhh cognition:304700322}  Sleep:  {BHH GOOD/FAIR/POOR:22877}   Screenings: GAD-7    Flowsheet Row Office Visit from 12/07/2022 in [facility] Office Visit from 05/25/2022 in [facility]  Total GAD-7 Score 5 1      PHQ2-9    Flowsheet Row Office Visit from 12/07/2022 in [facility] Office Visit from 05/25/2022 in [facility]  PHQ-2 Total Score 1 0  PHQ-9 Total Score 6 5        Collaboration of Care: {BH OP Collaboration of Care:21014065}  Patient/Guardian was advised Release of Information must be obtained prior to any record release in order to collaborate their care with an outside provider. Patient/Guardian was advised if they have not already done so to contact the registration department to sign all necessary forms in order for [place] to release information regarding their care.   Consent: Patient/Guardian gives verbal consent for treatment and assignment of benefits for services provided during this visit. Patient/Guardian expressed  understanding and agreed to proceed.   Stasia Cavalier, MD 3/17/20259:44 AM

## 2023-06-04 ENCOUNTER — Ambulatory Visit (HOSPITAL_COMMUNITY): Payer: Self-pay | Admitting: Psychiatry

## 2023-06-07 ENCOUNTER — Ambulatory Visit: Payer: 59 | Admitting: Internal Medicine

## 2023-06-16 ENCOUNTER — Other Ambulatory Visit: Payer: Self-pay | Admitting: Internal Medicine

## 2023-06-16 DIAGNOSIS — G4700 Insomnia, unspecified: Secondary | ICD-10-CM

## 2023-07-02 ENCOUNTER — Other Ambulatory Visit: Payer: Self-pay | Admitting: Internal Medicine

## 2023-07-02 DIAGNOSIS — Z8659 Personal history of other mental and behavioral disorders: Secondary | ICD-10-CM

## 2023-07-02 MED ORDER — AMPHETAMINE-DEXTROAMPHET ER 20 MG PO CP24
20.0000 mg | ORAL_CAPSULE | Freq: Every morning | ORAL | 0 refills | Status: DC
Start: 2023-07-02 — End: 2023-07-30

## 2023-07-19 ENCOUNTER — Ambulatory Visit: Admitting: Internal Medicine

## 2023-07-19 ENCOUNTER — Encounter: Payer: Self-pay | Admitting: Internal Medicine

## 2023-07-19 VITALS — BP 131/85 | HR 91 | Ht 72.0 in | Wt 213.2 lb

## 2023-07-19 DIAGNOSIS — E782 Mixed hyperlipidemia: Secondary | ICD-10-CM | POA: Diagnosis not present

## 2023-07-19 DIAGNOSIS — Z8659 Personal history of other mental and behavioral disorders: Secondary | ICD-10-CM | POA: Diagnosis not present

## 2023-07-19 DIAGNOSIS — L2082 Flexural eczema: Secondary | ICD-10-CM | POA: Diagnosis not present

## 2023-07-19 DIAGNOSIS — K219 Gastro-esophageal reflux disease without esophagitis: Secondary | ICD-10-CM

## 2023-07-19 DIAGNOSIS — F5104 Psychophysiologic insomnia: Secondary | ICD-10-CM

## 2023-07-19 DIAGNOSIS — E663 Overweight: Secondary | ICD-10-CM

## 2023-07-19 MED ORDER — TRIAMCINOLONE ACETONIDE 0.1 % EX CREA: 1.0000 | TOPICAL_CREAM | Freq: Every day | CUTANEOUS | 1 refills | Status: AC | PRN

## 2023-07-19 NOTE — Progress Notes (Signed)
 Established Patient Office Visit  Subjective   Patient ID: Shawn Matthews, male    DOB: 1995/05/09  Age: 28 y.o. MRN: [id number]  Chief Complaint  Patient presents with   ADHD    Six month follow up    Shawn Matthews returns to care today for routine follow-up.  He was last evaluated by me in September 2024.  A new psychiatry referral was placed at that time and a repeat RUQ U/S was ordered for follow-up imaging of a previously identified gallbladder polyp.  There have been no acute interval events.  Today he reports feeling well and has no acute concerns to discuss.  History reviewed. No pertinent past medical history. Past Surgical History:  Procedure Laterality Date   arm surgery Right    CIRCUMCISION     TONSILLECTOMY     Social History   Tobacco Use   Smoking status: Some Days    Current packs/day: 0.50    Types: Cigarettes   Smokeless tobacco: Never  Vaping Use   Vaping status: Never Used  Substance Use Topics   Alcohol use: No   Drug use: Yes    Types: Marijuana   History reviewed. No pertinent family history. Allergies  Allergen Reactions   Other     Bees   Review of Systems  Constitutional:  Negative for chills and fever.  HENT:  Negative for sore throat.   Respiratory:  Negative for cough and shortness of breath.   Cardiovascular:  Negative for chest pain, palpitations and leg swelling.  Gastrointestinal:  Negative for abdominal pain, blood in stool, constipation, diarrhea, nausea and vomiting.  Genitourinary:  Negative for dysuria and hematuria.  Musculoskeletal:  Negative for myalgias.  Skin:  Negative for itching and rash.  Neurological:  Negative for dizziness and headaches.  Psychiatric/Behavioral:  Negative for depression and suicidal ideas.      Objective:     BP 131/85   Pulse 91   Ht 6' (1.829 m)   Wt 213 lb 3.2 oz (96.7 kg)   SpO2 99%   BMI 28.92 kg/m  BP Readings from Last 3 Encounters:  07/19/23 131/85  12/07/22 132/88  07/11/22 (!)  154/92   Physical Exam Vitals reviewed.  Constitutional:      General: He is not in acute distress.    Appearance: Normal appearance. He is not ill-appearing.  HENT:     Head: Normocephalic and atraumatic.     Right Ear: External ear normal.     Left Ear: External ear normal.     Nose: Nose normal. No congestion or rhinorrhea.     Mouth/Throat:     Mouth: Mucous membranes are moist.     Pharynx: Oropharynx is clear.  Eyes:     General: No scleral icterus.    Extraocular Movements: Extraocular movements intact.     Conjunctiva/sclera: Conjunctivae normal.     Pupils: Pupils are equal, round, and reactive to light.  Cardiovascular:     Rate and Rhythm: Normal rate and regular rhythm.     Pulses: Normal pulses.     Heart sounds: Normal heart sounds. No murmur heard. Pulmonary:     Effort: Pulmonary effort is normal.     Breath sounds: Normal breath sounds. No wheezing, rhonchi or rales.  Abdominal:     General: Abdomen is flat. Bowel sounds are normal. There is no distension.     Palpations: Abdomen is soft.     Tenderness: There is no abdominal tenderness.  Musculoskeletal:  General: No swelling or deformity. Normal range of motion.     Cervical back: Normal range of motion.  Skin:    General: Skin is warm and dry.     Capillary Refill: Capillary refill takes less than 2 seconds.  Neurological:     General: No focal deficit present.     Mental Status: He is alert and oriented to person, place, and time.     Motor: No weakness.  Psychiatric:        Mood and Affect: Mood normal.        Behavior: Behavior normal.        Thought Content: Thought content normal.   Last CBC Lab Results  Component Value Date   WBC 6.6 07/19/2023   HGB 14.9 07/19/2023   HCT 45.3 07/19/2023   MCV 85 07/19/2023   MCH 27.9 07/19/2023   RDW 12.4 07/19/2023   PLT 252 07/19/2023   Last metabolic panel Lab Results  Component Value Date   GLUCOSE 88 07/19/2023   NA 143 07/19/2023   K  4.2 07/19/2023   CL 103 07/19/2023   CO2 23 07/19/2023   BUN 17 07/19/2023   CREATININE 1.09 07/19/2023   EGFR 94 07/19/2023   CALCIUM 9.9 07/19/2023   PROT 7.4 07/19/2023   ALBUMIN 4.6 07/19/2023   LABGLOB 2.8 07/19/2023   AGRATIO 2.0 05/25/2022   BILITOT 0.3 07/19/2023   ALKPHOS 105 07/19/2023   AST 29 07/19/2023   ALT 37 07/19/2023   Last lipids Lab Results  Component Value Date   CHOL 168 07/19/2023   HDL 51 07/19/2023   LDLCALC 92 07/19/2023   TRIG 144 07/19/2023   CHOLHDL 3.3 07/19/2023   Last hemoglobin A1c Lab Results  Component Value Date   HGBA1C 5.3 07/19/2023   Last thyroid functions Lab Results  Component Value Date   TSH 2.460 07/19/2023   Last vitamin D  Lab Results  Component Value Date   VD25OH 31.4 07/19/2023   Last vitamin B12 and Folate Lab Results  Component Value Date   VITAMINB12 571 07/19/2023   FOLATE 13.1 07/19/2023     Assessment & Plan:   Problem List Items Addressed This Visit       Eczema   Triamcinolone  cream remains effective for management of eczema flares.  Refill provided today.      History of ADHD - Primary   Symptoms remain adequately controlled with Adderall XR 20 mg daily.  PDMP reviewed and remains appropriate.  New psychiatry referral placed today at his request.  He is open to undergoing repeat testing for ADHD if required.      Insomnia   Remains adequately controlled with Seroquel  100 mg nightly.      Return in about 6 months (around 01/19/2024).   Tobi Fortes, MD

## 2023-07-19 NOTE — Patient Instructions (Signed)
 It was a pleasure to see you today.  Thank you for giving [place] the opportunity to be involved in your care.  Below is a brief recap of your visit and next steps.  We will plan to see you again in 6 months.  Summary No medication changes today Repeat labs ordered Follow up in 6 months

## 2023-07-20 LAB — LIPID PANEL
Chol/HDL Ratio: 3.3 ratio (ref 0.0–5.0)
Cholesterol, Total: 168 mg/dL (ref 100–199)
HDL: 51 mg/dL (ref >39)
LDL Chol Calc (NIH): 92 mg/dL (ref 0–99)
Triglycerides: 144 mg/dL (ref 0–149)
VLDL Cholesterol Cal: 25 mg/dL (ref 5–40)

## 2023-07-20 LAB — CMP14+EGFR
ALT: 37 IU/L (ref 0–44)
AST: 29 IU/L (ref 0–40)
Albumin: 4.6 g/dL (ref 4.3–5.2)
Alkaline Phosphatase: 105 IU/L (ref 44–121)
BUN/Creatinine Ratio: 16 (ref 9–20)
BUN: 17 mg/dL (ref 6–20)
Bilirubin Total: 0.3 mg/dL (ref 0.0–1.2)
CO2: 23 mmol/L (ref 20–29)
Calcium: 9.9 mg/dL (ref 8.7–10.2)
Chloride: 103 mmol/L (ref 96–106)
Creatinine, Ser: 1.09 mg/dL (ref 0.76–1.27)
Globulin, Total: 2.8 g/dL (ref 1.5–4.5)
Glucose: 88 mg/dL (ref 70–99)
Potassium: 4.2 mmol/L (ref 3.5–5.2)
Sodium: 143 mmol/L (ref 134–144)
Total Protein: 7.4 g/dL (ref 6.0–8.5)
eGFR: 94 mL/min/{1.73_m2} (ref >59)

## 2023-07-20 LAB — TSH+FREE T4
Free T4: 1.02 ng/dL (ref 0.82–1.77)
TSH: 2.46 u[IU]/mL (ref 0.450–4.500)

## 2023-07-20 LAB — CBC WITH DIFFERENTIAL/PLATELET
Basophils Absolute: 0 10*3/uL (ref 0.0–0.2)
Basos: 1 %
EOS (ABSOLUTE): 0.1 10*3/uL (ref 0.0–0.4)
Eos: 2 %
Hematocrit: 45.3 % (ref 37.5–51.0)
Hemoglobin: 14.9 g/dL (ref 13.0–17.7)
Immature Grans (Abs): 0 10*3/uL (ref 0.0–0.1)
Immature Granulocytes: 0 %
Lymphocytes Absolute: 2.4 10*3/uL (ref 0.7–3.1)
Lymphs: 36 %
MCH: 27.9 pg (ref 26.6–33.0)
MCHC: 32.9 g/dL (ref 31.5–35.7)
MCV: 85 fL (ref 79–97)
Monocytes Absolute: 0.6 10*3/uL (ref 0.1–0.9)
Monocytes: 8 %
Neutrophils Absolute: 3.5 10*3/uL (ref 1.4–7.0)
Neutrophils: 53 %
Platelets: 252 10*3/uL (ref 150–450)
RBC: 5.34 x10E6/uL (ref 4.14–5.80)
RDW: 12.4 % (ref 11.6–15.4)
WBC: 6.6 10*3/uL (ref 3.4–10.8)

## 2023-07-20 LAB — B12 AND FOLATE PANEL
Folate: 13.1 ng/mL (ref >3.0)
Vitamin B-12: 571 pg/mL (ref 232–1245)

## 2023-07-20 LAB — VITAMIN D 25 HYDROXY (VIT D DEFICIENCY, FRACTURES): Vit D, 25-Hydroxy: 31.4 ng/mL (ref 30.0–100.0)

## 2023-07-20 LAB — HEMOGLOBIN A1C
Est. average glucose Bld gHb Est-mCnc: 105 mg/dL
Hgb A1c MFr Bld: 5.3 % (ref 4.8–5.6)

## 2023-07-21 ENCOUNTER — Encounter: Payer: Self-pay | Admitting: Internal Medicine

## 2023-07-30 ENCOUNTER — Other Ambulatory Visit: Payer: Self-pay | Admitting: Internal Medicine

## 2023-07-30 DIAGNOSIS — Z8659 Personal history of other mental and behavioral disorders: Secondary | ICD-10-CM

## 2023-08-01 MED ORDER — AMPHETAMINE-DEXTROAMPHET ER 20 MG PO CP24
20.0000 mg | ORAL_CAPSULE | Freq: Every morning | ORAL | 0 refills | Status: DC
Start: 2023-08-01 — End: 2023-08-29

## 2023-08-09 ENCOUNTER — Encounter: Payer: Self-pay | Admitting: Internal Medicine

## 2023-08-09 NOTE — Assessment & Plan Note (Signed)
 Remains adequately controlled with Seroquel  100 mg nightly.

## 2023-08-09 NOTE — Assessment & Plan Note (Signed)
 Symptoms remain adequately controlled with Adderall XR 20 mg daily.  PDMP reviewed and remains appropriate.  New psychiatry referral placed today at his request.  He is open to undergoing repeat testing for ADHD if required.

## 2023-08-09 NOTE — Assessment & Plan Note (Signed)
 Triamcinolone  cream remains effective for management of eczema flares.  Refill provided today.

## 2023-08-29 ENCOUNTER — Other Ambulatory Visit: Payer: Self-pay | Admitting: Internal Medicine

## 2023-08-29 DIAGNOSIS — Z8659 Personal history of other mental and behavioral disorders: Secondary | ICD-10-CM

## 2023-08-29 MED ORDER — AMPHETAMINE-DEXTROAMPHET ER 20 MG PO CP24
20.0000 mg | ORAL_CAPSULE | Freq: Every morning | ORAL | 0 refills | Status: DC
Start: 2023-08-29 — End: 2023-09-30

## 2023-08-29 NOTE — Telephone Encounter (Signed)
 Copied from CRM [id number]. Topic: Clinical - Medication Refill >> Aug 29, 2023 12:43 PM Lin Rend J wrote: Medication: amphetamine -dextroamphetamine  (ADDERALL XR) 20 MG 24 hr capsule  Has the patient contacted their pharmacy? Yes (Agent: If no, request that the patient contact the pharmacy for the refill. If patient does not wish to contact the pharmacy document the reason why and proceed with request.) (Agent: If yes, when and what did the pharmacy advise?)  This is the patient's preferred pharmacy:  [id number] - [place], [place] - [address] AT [facility] [address] [place] [address] Phone: [phone] Fax: [phone]  Is this the correct pharmacy for this prescription? Yes If no, delete pharmacy and type the correct one.   Has the prescription been filled recently? Yes  Is the patient out of the medication? No  Has the patient been seen for an appointment in the last year OR does the patient have an upcoming appointment? Yes  Can we respond through MyChart? Yes  Agent: Please be advised that Rx refills may take up to 3 business days. We ask that you follow-up with your pharmacy.

## 2023-09-24 ENCOUNTER — Other Ambulatory Visit: Payer: Self-pay | Admitting: Internal Medicine

## 2023-09-24 DIAGNOSIS — K219 Gastro-esophageal reflux disease without esophagitis: Secondary | ICD-10-CM

## 2023-09-30 ENCOUNTER — Telehealth: Payer: Self-pay

## 2023-09-30 ENCOUNTER — Other Ambulatory Visit: Payer: Self-pay

## 2023-09-30 DIAGNOSIS — Z8659 Personal history of other mental and behavioral disorders: Secondary | ICD-10-CM

## 2023-09-30 MED ORDER — AMPHETAMINE-DEXTROAMPHET ER 20 MG PO CP24
20.0000 mg | ORAL_CAPSULE | Freq: Every morning | ORAL | 0 refills | Status: DC
Start: 2023-09-30 — End: 2023-10-30

## 2023-09-30 NOTE — Telephone Encounter (Signed)
 Call left voicemail to call back

## 2023-09-30 NOTE — Telephone Encounter (Unsigned)
 Copied from CRM [id number]. Topic: Clinical - Medication Refill >> Sep 30, 2023 10:33 AM Tiffini S wrote: Medication:  amphetamine -dextroamphetamine  (ADDERALL XR) 20 MG 24 hr capsule   Has the patient contacted their pharmacy? No (Agent: If no, request that the patient contact the pharmacy for the refill. If patient does not wish to contact the pharmacy document the reason why and proceed with request.) (Agent: If yes, when and what did the pharmacy advise?)  This is the patient's preferred pharmacy:  [id number] - [place], [place] - [address] AT [facility] [address] [place] [address] Phone: [phone] Fax: [phone]  Is this the correct pharmacy for this prescription? Yes If no, delete pharmacy and type the correct one.   Has the prescription been filled recently? Yes  Is the patient out of the medication? Yes  Has the patient been seen for an appointment in the last year OR does the patient have an upcoming appointment? Yes  Can we respond through MyChart? Yes  Agent: Please be advised that Rx refills may take up to 3 business days. We ask that you follow-up with your pharmacy.

## 2023-09-30 NOTE — Telephone Encounter (Signed)
 Called to advise pt that his refill has been sent to his phamract for the Adderall XR 20mg  tablet

## 2023-09-30 NOTE — Telephone Encounter (Signed)
Pt called back and verbalized understanding.

## 2023-10-30 ENCOUNTER — Other Ambulatory Visit: Payer: Self-pay

## 2023-10-30 DIAGNOSIS — Z8659 Personal history of other mental and behavioral disorders: Secondary | ICD-10-CM

## 2023-10-30 MED ORDER — AMPHETAMINE-DEXTROAMPHET ER 20 MG PO CP24
20.0000 mg | ORAL_CAPSULE | Freq: Every morning | ORAL | 0 refills | Status: DC
Start: 2023-10-30 — End: 2023-11-11

## 2023-10-30 NOTE — Telephone Encounter (Signed)
 Copied from CRM [id number]. Topic: Clinical - Medication Refill >> Oct 30, 2023  9:18 AM Shawn Matthews wrote: Medication: amphetamine -dextroamphetamine  (ADDERALL XR) 20 MG 24 hr capsule  Has the patient contacted their pharmacy? No (Agent: If no, request that the patient contact the pharmacy for the refill. If patient does not wish to contact the pharmacy document the reason why and proceed with request.) (Agent: If yes, when and what did the pharmacy advise?)  This is the patient's preferred pharmacy:  [id number] - [place], [place] - [address] AT [facility] [address]  [place] [address] Phone: [phone] Fax: [phone]  Is this the correct pharmacy for this prescription? Yes If no, delete pharmacy and type the correct one.   Has the prescription been filled recently? Yes, 09/30/2023  Is the patient out of the medication? Yes  Has the patient been seen for an appointment in the last year OR does the patient have an upcoming appointment? Yes  Can we respond through MyChart? Yes  Agent: Please be advised that Rx refills may take up to 3 business days. We ask that you follow-up with your pharmacy.

## 2023-11-11 ENCOUNTER — Ambulatory Visit (INDEPENDENT_AMBULATORY_CARE_PROVIDER_SITE_OTHER): Admitting: Registered Nurse

## 2023-11-11 ENCOUNTER — Encounter (HOSPITAL_COMMUNITY): Payer: Self-pay | Admitting: Registered Nurse

## 2023-11-11 DIAGNOSIS — Z8659 Personal history of other mental and behavioral disorders: Secondary | ICD-10-CM | POA: Diagnosis not present

## 2023-11-11 DIAGNOSIS — G4700 Insomnia, unspecified: Secondary | ICD-10-CM

## 2023-11-11 MED ORDER — AMPHET-DEXTROAMPHET 3-BEAD ER 25 MG PO CP24: 20.0000 mg | ORAL_CAPSULE | Freq: Every morning | ORAL | 0 refills | Status: DC

## 2023-11-11 MED ORDER — QUETIAPINE FUMARATE 100 MG PO TABS: 100.0000 mg | ORAL_TABLET | Freq: Every day | ORAL | 1 refills | Status: DC

## 2023-11-11 NOTE — Progress Notes (Signed)
 Psychiatric Initial Adult Assessment   Patient Identification: Shawn Matthews MRN:  [id number] Date of Evaluation:  11/11/2023 Referral Source: Melvenia Manus BRAVO, MD [facility] Chief Complaint:   Chief Complaint  Patient presents with   Establish Care    Medication management   Visit Diagnosis:    ICD-10-CM   1. Insomnia, unspecified type  G47.00 QUEtiapine  (SEROQUEL ) 100 MG tablet    2. History of ADHD  Z86.59 amphetamine -dextroamphetamine  25 MG CP24     History of Present Illness:  Shawn Matthews 28 y.o. male presents to office today to establish care for medication management.  He is seen face to face by this provider, and chart reviewed on 11/11/23.  His psychiatric history is significant for ADD, ADHD, Bipolar 2 disorder, major depression, and anxiety.  His mental health is currently managed with Adderall XR 20 mg and Seroquel  100 mg daily at bedtime.  Reports he has been taking the same tow medications for the last 3-4 years and have been effectively managing his mental health.  However recently states the Adderall seems to be wearing off earlier.  When fist stared was fine.  I was able to focus, concentrate, and complete task but now it seems like it wears off around 1-2 PM.  When asked about an increase in dose his PCP referred him to psychiatry.  He states that he has been trying to get into psychiatry for a while but every time it is always the test or asking that the test be done prior to treatment.  The last provider wanted until the day before the visit telling me I need to have test for ADHD before medication can be prescribed.  That was just prior to my wedding and I didn't have time to have test done prior to visit.  He states he has no problem with testing and asked for resources for testing.  He reports there are no primary stressors other than daily life and situational issues that may occur.   He reports he is eating and sleeping without difficulty Today he  denies suicidal/self-harm/homicidal ideation, psychosis, paranoia, and abnormal movements.  PHQ 2/9, C-SSRS, GAD 7, AUDIT, AIMS, nutrition, and pain screenings conducted during today's visit, see scores below.  Recommended the following:  Continue Seroquel  100 mg daily at bed time.  Increased Adderall XR 25 mg daily.  Resources given for ADHD testing.     He voices understanding/agreement with information/recommendations being given to him today.    Associated Signs/Symptoms: Depression Symptoms:  depressed mood, loss of energy/fatigue, (Hypo) Manic Symptoms:  Irritable Mood, Anxiety Symptoms:  Excessive Worry, Psychotic Symptoms:  Denies PTSD Symptoms: NA  Past Psychiatric History:  Diagnosis: ADD, Bipolar 2, general anxiety Suicide attempt:  Denies Non-suicidal self-injurious behavior:  Denies Psychiatric hospitalization:  Denies Past trauma/neglect/abuse:  Reports a history of pretty bad child hood. Both parents were drug addicts and alcoholics. Substance abuse:  Reports a history of Just about any party drug marijuana, cocaine ecstasy, prescribed pills during teenage and early college years.  Report last use of all illicit drugs except marijuana was 7 yr ago.  Reports daily use of concentrated CBD oil (smokes) daily after work, it helps me to calm down and relax.     Past psychotropic medication trials:  Wellbutrin (stopped after manic episode and GI problems).  Currently taking Adderall XR and Seroquel  for the last 3-4 years.     Previous Psychotropic Medications: Yes , see above  Substance Abuse History in the  last 12 months:  Yes.    Consequences of Substance Abuse: Denies  Past Medical History:  Past Medical History:  Diagnosis Date   ADHD (attention deficit hyperactivity disorder)    Anxiety    Bipolar disorder (HCC)     Past Surgical History:  Procedure Laterality Date   arm surgery Right    CIRCUMCISION     TONSILLECTOMY      Family Psychiatric History: See  below in family history.  Reports a significant family history of mental illness on maternal sided of family.  States his biological mother, aunt, grand mother and mother all had mental illness and biological grandmother killed her self. He states that his mother and her twin sister were adopted and family history is what was told to him by his mother.    Family History:  Family History  Problem Relation Age of Onset   Drug abuse Mother    Depression Mother    Bipolar disorder Mother    Anxiety disorder Mother    Alcohol abuse Mother    Schizophrenia Sister    Mental illness Maternal Aunt    Mental illness Maternal Grandmother     Social History:   Social History   Socioeconomic History   Marital status: Married    Spouse name: Not on file   Number of children: 0   Years of education: college   Highest education level: Bachelor's degree (e.g., BA, AB, BS)  Occupational History   Not on file  Tobacco Use   Smoking status: Some Days    Current packs/day: 0.50    Types: Cigarettes   Smokeless tobacco: Never  Vaping Use   Vaping status: Never Used  Substance and Sexual Activity   Alcohol use: No   Drug use: Yes    Types: Marijuana   Sexual activity: Yes    Birth control/protection: None  Other Topics Concern   Not on file  Social History Narrative   Not on file   Social Drivers of Health   Financial Resource Strain: Not on file  Food Insecurity: Not on file  Transportation Needs: Not on file  Physical Activity: Not on file  Stress: Not on file  Social Connections: Not on file    Additional Social History: Employed in IT, 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  Allergies:   Allergies  Allergen Reactions   Other     Bees    Metabolic Disorder Labs:  Reviewed Lab Results  Component Value Date   HGBA1C 5.3 07/19/2023   No results found for: PROLACTIN Lab Results  Component Value Date   CHOL 168 07/19/2023   TRIG 144 07/19/2023   HDL 51 07/19/2023    CHOLHDL 3.3 07/19/2023   LDLCALC 92 07/19/2023   LDLCALC 130 (H) 05/25/2022   Lab Results  Component Value Date   TSH 2.460 07/19/2023     Current Medications: Current Outpatient Medications  Medication Sig Dispense Refill   famotidine  (PEPCID ) 40 MG tablet TAKE 1 TABLET BY MOUTH TWICE A DAY 180 tablet 1   triamcinolone  cream (KENALOG ) 0.1 % Apply 1 Application topically daily as needed (eczema flare). 30 g 1   amphetamine -dextroamphetamine  25 MG CP24 Take 0.8 capsules (20 mg total) by mouth every morning. 30 capsule 0   ibuprofen  (ADVIL ,MOTRIN ) 800 MG tablet Take 1 tablet (800 mg total) by mouth 3 (three) times daily. (Patient not taking: Reported on 11/11/2023) 21 tablet 0   QUEtiapine  (SEROQUEL ) 100 MG tablet Take 1 tablet (100  mg total) by mouth at bedtime. 90 tablet 1   No current facility-administered medications for this visit.    Musculoskeletal: Strength & Muscle Tone: within normal limits Gait & Station: normal Patient leans: N/A  Psychiatric Specialty Exam: Review of Systems  Constitutional:        No other complaints voiced at this time.   Psychiatric/Behavioral:  Positive for decreased concentration. Negative for hallucinations, self-injury, sleep disturbance and suicidal ideas. Agitation: Mood stable. Dysphoric mood: Stable.The patient is not hyperactive. Nervous/anxious: Stable.  All other systems reviewed and are negative.   Blood pressure 136/87, pulse 75, height 6' (1.829 m), weight 228 lb 3.2 oz (103.5 kg), SpO2 99%.Body mass index is 30.95 kg/m.  General Appearance: Casual and Neat  Eye Contact:  Good  Speech:  Clear and Coherent and Normal Rate  Volume:  Normal  Mood:  Euthymic  Affect:  NA and Congruent  Thought Process:  Coherent, Goal Directed, and Descriptions of Associations: Intact  Orientation:  Full (Time, Place, and Person)  Thought Content:  Logical  Suicidal Thoughts:  No  Homicidal Thoughts:  No  Memory:  Immediate;   Good Recent;    Good Remote;   Good  Judgement:  Intact  Insight:  Good  Psychomotor Activity:  Normal  Concentration:  Concentration: Good and Attention Span: Good  Recall:  Good  Fund of Knowledge:Good  Language: Good  Akathisia:  No  Handed:  Right  AIMS (if indicated):  done  Assets:  Communication Skills Desire for Improvement Financial Resources/Insurance Housing Intimacy Leisure Time Physical Health Resilience Social Support Transportation Vocational/Educational  ADL's:  Intact  Cognition: WNL  Sleep:  Good   Screenings: GAD-7    Flowsheet Row Office Visit from 11/11/2023 in [facility] [place] Office Visit from 07/19/2023 in [facility] Office Visit from 12/07/2022 in [facility] Office Visit from 05/25/2022 in [facility]  Total GAD-7 Score 7 3 5 1    PHQ2-9    Flowsheet Row Office Visit from 11/11/2023 in [facility] [place] Office Visit from 07/19/2023 in [facility] Office Visit from 12/07/2022 in [facility] Office Visit from 05/25/2022 in [facility]  PHQ-2 Total Score 1 0 1 0  PHQ-9 Total Score 8 5 6 5    Flowsheet Row Office Visit from 11/11/2023 in [facility] [place]  C-SSRS RISK CATEGORY No Risk   Assessment and Plan:  Assessment: Patient seen and examined as noted above. Summary: Today Shawn Matthews appears to be doing well.  Reports current medication regimen is effectively managing mental health without adverse reactions but Adderall seems to be wearing off sooner that did when first started.   Wears off before he gets off work and affects concentration, focus, and completing task.  Agrees to ADHD testing and resources given.  Eating and sleeping without difficulty.  Daily marijuana use to help relax and calm down after work.  He denies  suicidal/self-harm/homicidal ideation, psychosis, paranoia, and abnormal movements. During visit he is dressed appropriate for age and weather.  He is seated comfortably in chair with no noted distress.  He is alert/oriented x 4, calm/cooperative and mood is congruent with affect.  He spoke in a clear tone at moderate volume, and normal pace, with good eye contact.  His thought process is coherent, relevant, and there is no indication that he  is currently responding to internal/external stimuli or experiencing delusional thought content.    1. Insomnia, unspecified type - QUEtiapine  (SEROQUEL ) 100 MG tablet; Take 1 tablet (100 mg total) by mouth at bedtime.  Dispense: 90 tablet; Refill: 1  2. History of ADHD - amphetamine -dextroamphetamine  25 MG CP24; Take 0.8 capsules (20 mg total) by mouth every morning.  Dispense: 30 capsule; Refill: 0   Plan: Medications: Meds ordered this encounter  Medications   QUEtiapine  (SEROQUEL ) 100 MG tablet    Sig: Take 1 tablet (100 mg total) by mouth at bedtime.    Dispense:  90 tablet    Refill:  1    Supervising Provider:   ARFEEN, SYED T [[id number]]   amphetamine -dextroamphetamine  25 MG CP24    Sig: Take 0.8 capsules (20 mg total) by mouth every morning.    Dispense:  30 capsule    Refill:  0    Supervising Provider:   CURRY PATERSON T [[id number]]    Labs:  Not indicated at this time.  Most recent labs reviewed  Other:  Declines counseling/therapy at this time.   Shawn Matthews is instructed to call 911, [phone], mobile crisis, or present to the nearest emergency room should he experience any suicidal/homicidal ideation, auditory/visual/hallucinations, or detrimental worsening of his mental health condition.   He is advised of the risk and adverse reactions to mental health with the use of marijuana and is advised to reduce marijuana use and to consider quitting Resources for ADHD testing added to AVS. Shawn Matthews participated in the development of this treatment  plan and verbalized his understanding/agreement with plan as listed.  Follow Up: Return in 1 month for medication management.  Call in the interim for any side-effects, decompensation, questions, or problems  Collaboration of Care: Medication Management AEB medication assessment, adjustment, refills, Primary Care Provider AEB for EKG, and Patient refused AEB declined counseling/therapy at this time.  Patient/Guardian was advised Release of Information must be obtained prior to any record release in order to collaborate their care with an outside provider. Patient/Guardian was advised if they have not already done so to contact the registration department to sign all necessary forms in order for us  to release information regarding their care.   Consent: Patient/Guardian gives verbal consent for treatment and assignment of benefits for services provided during this visit. Patient/Guardian expressed understanding and agreed to proceed.   Shawn Staples, NP 8/25/20258:17 PM

## 2023-11-11 NOTE — Patient Instructions (Addendum)
  ADHD Online Testing         Address:  [address]?[place], [place] [address] Phone: [phone] Fax: [phone] Email:  [email or web address]  ADHD Care in [place], [place] At [facility], we are dedicated to providing comprehensive ADHD treatment and management for patients of all ages. Our range of services is designed to address the unique needs of each individual, ensuring personalized and effective care.  ADHD Evaluation and Diagnosis Accurate diagnosis is the first step towards effective treatment. Our thorough evaluation process includes:   QbTest to objectively measure ADHD symptoms  Standardized ADHD rating scales  Behavioral assessments  Medical history review  Individualized Treatment Plans We believe in personalized care. Based on your evaluation, we develop a tailored treatment plan that may include:   Medication management  Lifestyle and dietary recommendations  Parent and family education  Social skills training  Organizational and time management strategies  [facility] offers in-person therapy and testing services. We are open Monday to Thursday from 9 AM to 7 PM, and on Friday from 9 AM - 5 PM.  If you would like to request individual therapy, group therapy, or testing services, please call our main line at [phone] or click "Get Started Now" to submit an Interest Form! [facility] [address] [place], [place] [address] Phone [phone]; Fax [phone]  Adult Testing We provide comprehensive psychological evaluations for a variety of cognitive, educational, emotional, and behavioral difficulties for adults. Some types of evaluations we provide include:  Full Psychological Evaluations Each evaluation is tailored to address the referral question and current difficulties clients have when seeking services. Clients who are experiencing cognitive, emotional,  behavioral, and/or interpersonal difficulties. This type of evaluation may benefit clients by gaining a better understanding of his/her/their mental health condition and it can also determine whether he/she/they require therapeutic services and/or academic accommodations. Note we do not offer full psychological evaluations for Autism Spectrum Disorder in adults. Examples of referral questions that are most appropriate for Full Psychological Evaluations are:  Depression Anxiety Post Traumatic Stress Disorder (PTSD) Personality Disorders Obsessive-Compulsive Disorder (OCD) Attention Deficit/Hyperactivity Disorder (ADHD) Learning Disorders (LD/SLDs) for individuals with the following referral questions: Difficulties with Written Language (e.g., dysgraphia) Difficulties with Mathematical Skills and Calculation (e.g., dyscalculia) Difficulties with Reading Skills (e.g., dyslexia)   Call 911, 988, mobile crisis, or present to the nearest emergency room should you experience any suicidal/homicidal ideation, auditory/visual/hallucinations, or detrimental worsening of your mental health.  Mobile Crisis Response Teams Listed by counties in vicinity of [facility] providers [facility] [phone] [facility] [phone] [facility] [phone] [facility] [phone] [place]                * [facility] [phone]                * [facility] [phone]  [facility] [phone] [facility] [employer]  [phone] * [facility] [phone]

## 2023-11-21 ENCOUNTER — Telehealth (HOSPITAL_COMMUNITY): Payer: Self-pay | Admitting: *Deleted

## 2023-11-21 NOTE — Telephone Encounter (Signed)
 [facility] [place] [facility] of [address] sent fax requesting clarification for patient amphetamine -dextroamphetamine .  Per pt pharmacy, should patient be taking a whole capsule for the 25mg  or the 20mg . Pharmacy would like for provider to please send in new script with correct SIG so they can fill med for patient.    Please advise

## 2023-11-25 MED ORDER — AMPHETAMINE-DEXTROAMPHET ER 25 MG PO CP24: 25.0000 mg | ORAL_CAPSULE | ORAL | 0 refills | Status: DC

## 2023-11-25 NOTE — Addendum Note (Signed)
 Addended by: Laveyah Oriol B on: 11/25/2023 02:34 PM   Modules accepted: Orders

## 2023-11-25 NOTE — Telephone Encounter (Signed)
 Pharmacy would like for the SIG on pt amphetamine -dextroamphetamine  to reflect that he's taking the 25mg  once a day. So provider needs to send new script that reflects the correct direction. Per pharmacy they still received the direction of taking 0.888 tablets (20mg ) daily with the main script stating 25mg . Per pharmacy, this is how they need to receive the script and what it needs to say if provider wants script to reflect the 25mg  and the direction should be 1 tab every day.   Amphetamine -Dextroamphetamine  25mg  Take One Tab by mouth Daily

## 2023-11-26 NOTE — Telephone Encounter (Signed)
 noted

## 2023-12-16 ENCOUNTER — Telehealth (HOSPITAL_COMMUNITY): Admitting: Registered Nurse

## 2023-12-30 ENCOUNTER — Telehealth (HOSPITAL_COMMUNITY): Payer: Self-pay

## 2023-12-30 ENCOUNTER — Ambulatory Visit: Payer: Self-pay

## 2023-12-30 NOTE — Telephone Encounter (Signed)
 Pt called in requesting a refill on his amphetamine -dextroamphetamine  (ADDERALL XR) 25 MG 24 hr [facility] [address] Pt scheduled 01/06/24. Please advise.

## 2023-12-30 NOTE — Telephone Encounter (Signed)
 FYI Only or Action Required?: FYI only for provider.  Patient was last seen in primary care on 07/19/2023 by Melvenia Manus BRAVO, MD.  Called Nurse Triage reporting Arm Pain.  Symptoms began a week ago.  Interventions attempted: OTC medications: ibuprofen /excedrin.  Symptoms are: unchanged.  Triage Disposition: See PCP When Office is Open (Within 3 Days)  Patient/caregiver understands and will follow disposition?: Yes  Copied from CRM [id number]. Topic: Clinical - Red Word Triage >> Dec 30, 2023  9:34 AM Carlyon D wrote: Red Word that prompted transfer to Nurse Triage: R arm severe nerve  pain, slight swelling, Has plate and 6 screws in arm. Reason for Disposition  [1] MODERATE pain (e.g., interferes with normal activities) AND [2] present > 3 days  Answer Assessment - Initial Assessment Questions 1. ONSET: When did the pain start?     Initial surgery 16 years ago, pain flared up about a week ago 2. LOCATION: Where is the pain located?     Right upper arm and elbow 3. PAIN: How bad is the pain? (Scale 0-10; or none, mild, moderate, severe)     4/10 at rest, 8/10 with any activity 4. WORK OR EXERCISE: Has there been any recent work or exercise that involved this part of the body?     denies 5. CAUSE: What do you think is causing the arm pain?     Past surgery, flares up this time of year with the season changing 6. OTHER SYMPTOMS: Do you have any other symptoms? (e.g., neck pain, swelling, rash, fever, numbness, weakness)     denies 7. PREGNANCY: Is there any chance you are pregnant? When was your last menstrual period?     N/A  Patient has tried ibuprofen  and excedrin without relief.  Has used Gabapentin  in the past with success  Protocols used: Arm Pain-A-AH

## 2023-12-31 ENCOUNTER — Ambulatory Visit: Payer: Self-pay

## 2023-12-31 ENCOUNTER — Encounter (HOSPITAL_COMMUNITY): Payer: Self-pay | Admitting: Registered Nurse

## 2023-12-31 ENCOUNTER — Other Ambulatory Visit (HOSPITAL_COMMUNITY): Payer: Self-pay | Admitting: Registered Nurse

## 2023-12-31 VITALS — BP 112/76 | HR 82 | Ht 73.0 in | Wt 230.0 lb

## 2023-12-31 DIAGNOSIS — M79601 Pain in right arm: Secondary | ICD-10-CM

## 2023-12-31 MED ORDER — GABAPENTIN 100 MG PO CAPS
100.0000 mg | ORAL_CAPSULE | Freq: Three times a day (TID) | ORAL | 3 refills | Status: AC
Start: 2023-12-31 — End: ?

## 2023-12-31 MED ORDER — AMPHETAMINE-DEXTROAMPHET ER 25 MG PO CP24: 25.0000 mg | ORAL_CAPSULE | ORAL | 0 refills | Status: DC

## 2023-12-31 MED ORDER — METHYLPREDNISOLONE 4 MG PO TBPK
ORAL_TABLET | ORAL | 0 refills | Status: DC
Start: 2023-12-31 — End: 2024-01-21
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
## 2024-01-01 NOTE — Telephone Encounter (Signed)
 Spoke with pt advised rx has been sent in to pharmacy he verbalized understanding

## 2024-01-06 ENCOUNTER — Encounter (HOSPITAL_COMMUNITY): Payer: Self-pay | Admitting: Registered Nurse

## 2024-01-06 ENCOUNTER — Telehealth (INDEPENDENT_AMBULATORY_CARE_PROVIDER_SITE_OTHER): Admitting: Registered Nurse

## 2024-01-06 DIAGNOSIS — F909 Attention-deficit hyperactivity disorder, unspecified type: Secondary | ICD-10-CM

## 2024-01-06 DIAGNOSIS — F411 Generalized anxiety disorder: Secondary | ICD-10-CM

## 2024-01-06 DIAGNOSIS — F3181 Bipolar II disorder: Secondary | ICD-10-CM | POA: Diagnosis not present

## 2024-01-06 DIAGNOSIS — G4700 Insomnia, unspecified: Secondary | ICD-10-CM | POA: Diagnosis not present

## 2024-01-06 MED ORDER — AMPHETAMINE-DEXTROAMPHET ER 30 MG PO CP24: 30.0000 mg | ORAL_CAPSULE | ORAL | 0 refills | Status: DC

## 2024-01-06 MED ORDER — QUETIAPINE FUMARATE 100 MG PO TABS: 100.0000 mg | ORAL_TABLET | Freq: Every day | ORAL | 1 refills | Status: DC

## 2024-01-06 NOTE — Progress Notes (Signed)
 [facility] MD/PA/NP OP Progress Note  01/06/2024 3:14 PM Shawn Matthews  MRN:  [id number]  Virtual Visit via Video Note  I connected with Shawn Matthews on 01/06/24 at  2:00 PM EDT by a video enabled telemedicine application and verified that I am speaking with the correct person using two identifiers.  Location: Patient: Parked car Provider: Davene GLAD, [place]   I discussed the limitations of evaluation and management by telemedicine and the availability of in person appointments. The patient expressed understanding and agreed to proceed.  I discussed the assessment and treatment plan with the patient. The patient was provided an opportunity to ask questions and all were answered. The patient agreed with the plan and demonstrated an understanding of the instructions.   The patient was advised to call back or seek an in-person evaluation if the symptoms worsen or if the condition fails to improve as anticipated.  I provided 40 minutes of non-face-to-face time during this encounter.   Luisa Ruder, NP   Chief Complaint: No chief complaint on file.  HPI: Shawn Matthews 28 y.o. male presents today for medication management follow up.  He was seen via virtual video visit by this provide and chart reviewed on 01/06/24  His psychiatric history is significant for ADD, ADHD, Bipolar 2 disorder, major depression, and anxiety.  His mental health is currently managed with Seroquel  100 mg nightly and Adderall XR 25 mg daily.  He reports he has noticed improvement in his focus and concentration since restarting Adderall.  He reports he does not feel he is where he needs to be.  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  Reports he was taking 30 mg prior and did much better with the 30.  He reports he is eating and sleeping without difficulty.  He reports there has been improvement in his mood, depression, anxiety, and sleep.  Today he denies suicidal/self-harm/homicidal  ideation, psychosis, paranoia, and abnormal movements.  Screenings completed during today's visit AIMS, Nutrition, and Pain, see scores below.    Recommendations: Increase Adderall XR 30 mg daily and continue Seroquel  100 mg nightly He voiced understanding and agreement with today's plan and recommendations.  Visit Diagnosis:    ICD-10-CM   1. Attention deficit hyperactivity disorder (ADHD), unspecified ADHD type  F90.9 amphetamine -dextroamphetamine  (ADDERALL XR) 30 MG 24 hr capsule    amphetamine -dextroamphetamine  (ADDERALL XR) 30 MG 24 hr capsule    amphetamine -dextroamphetamine  (ADDERALL XR) 30 MG 24 hr capsule    DISCONTINUED: amphetamine -dextroamphetamine  (ADDERALL XR) 30 MG 24 hr capsule    DISCONTINUED: amphetamine -dextroamphetamine  (ADDERALL XR) 30 MG 24 hr capsule    2. Insomnia, unspecified type  G47.00 QUEtiapine  (SEROQUEL ) 100 MG tablet    3. GAD (generalized anxiety disorder)  F41.1 QUEtiapine  (SEROQUEL ) 100 MG tablet    4. Bipolar 2 disorder (HCC)  F31.81 QUEtiapine  (SEROQUEL ) 100 MG tablet      Past Psychiatric History:  Diagnosis: ADD, Bipolar 2, general anxiety Suicide attempt:  Denies Non-suicidal self-injurious behavior:  Denies Psychiatric hospitalization:  Denies Past trauma/neglect/abuse:  Reports a history of pretty bad child hood. Both parents were drug addicts and alcoholics. Substance abuse:  Reports a history of Just about any party drug marijuana, cocaine ecstasy, prescribed pills during teenage and early college years.  Report last use of all illicit drugs except marijuana was 7 yr ago.  Reports daily use of concentrated CBD oil (smokes) daily after work, it helps me to calm down and relax.  Past psychotropic medication trials:  Wellbutrin (stopped after manic episode and GI problems).  Currently taking Adderall XR and Seroquel  for the last 3-4 years.     Past Medical History:  Past Medical History:  Diagnosis Date   ADHD (attention deficit  hyperactivity disorder)    Anxiety    Bipolar disorder (HCC)     Past Surgical History:  Procedure Laterality Date   arm surgery Right    CIRCUMCISION     TONSILLECTOMY      Family Psychiatric History: See below in family history. Reports a significant family history of mental illness on maternal sided of family. States his biological mother, aunt, grand mother and mother all had mental illness and biological grandmother killed her self. He states that his mother and her twin sister were adopted and family history is what was told to him by his mother.   Family History:  Family History  Problem Relation Age of Onset   Drug abuse Mother    Depression Mother    Bipolar disorder Mother    Anxiety disorder Mother    Alcohol abuse Mother    Schizophrenia Sister    Mental illness Maternal Aunt    Mental illness Maternal Grandmother     Social History:  Social History   Socioeconomic History   Marital status: Married    Spouse name: Not on file   Number of children: 0   Years of education: college   Highest education level: Bachelor's degree (e.g., BA, AB, BS)  Occupational History   Not on file  Tobacco Use   Smoking status: Some Days    Current packs/day: 0.50    Types: Cigarettes   Smokeless tobacco: Never  Vaping Use   Vaping status: Never Used  Substance and Sexual Activity   Alcohol use: No   Drug use: Yes    Types: Marijuana   Sexual activity: Yes    Birth control/protection: None  Other Topics Concern   Not on file  Social History Narrative   Not on file   Social Drivers of Health   Financial Resource Strain: Not on file  Food Insecurity: Not on file  Transportation Needs: Not on file  Physical Activity: Not on file  Stress: Not on file  Social Connections: Not on file    Allergies:  Allergies  Allergen Reactions   Other     Bees    Metabolic Disorder Labs: Lab Results  Component Value Date   HGBA1C 5.3 07/19/2023   No results found for:  PROLACTIN Lab Results  Component Value Date   CHOL 168 07/19/2023   TRIG 144 07/19/2023   HDL 51 07/19/2023   CHOLHDL 3.3 07/19/2023   LDLCALC 92 07/19/2023   LDLCALC 130 (H) 05/25/2022   Lab Results  Component Value Date   TSH 2.460 07/19/2023   TSH 1.140 05/25/2022    Current Medications: Current Outpatient Medications  Medication Sig Dispense Refill   [START ON 01/30/2024] amphetamine -dextroamphetamine  (ADDERALL XR) 30 MG 24 hr capsule Take 1 capsule (30 mg total) by mouth every morning. 30 capsule 0   [START ON 02/27/2025] amphetamine -dextroamphetamine  (ADDERALL XR) 30 MG 24 hr capsule Take 1 capsule (30 mg total) by mouth every morning. 30 capsule 0   [START ON 03/30/2024] amphetamine -dextroamphetamine  (ADDERALL XR) 30 MG 24 hr capsule Take 1 capsule (30 mg total) by mouth every morning. 30 capsule 0   famotidine  (PEPCID ) 40 MG tablet TAKE 1 TABLET BY MOUTH TWICE A DAY 180 tablet 1  gabapentin  (NEURONTIN ) 100 MG capsule Take 1 capsule (100 mg total) by mouth 3 (three) times daily. 90 capsule 3   methylPREDNISolone (MEDROL DOSEPAK) 4 MG TBPK tablet Take as package instructions. 21 each 0   QUEtiapine  (SEROQUEL ) 100 MG tablet Take 1 tablet (100 mg total) by mouth at bedtime. 90 tablet 1   triamcinolone  cream (KENALOG ) 0.1 % Apply 1 Application topically daily as needed (eczema flare). 30 g 1   No current facility-administered medications for this visit.     Musculoskeletal: Strength & Muscle Tone: Unable to assess via virtual visit Gait & Station: Unable to assess via virtual visit Patient leans: N/A  Psychiatric Specialty Exam: Review of Systems  There were no vitals taken for this visit.There is no height or weight on file to calculate BMI.  General Appearance: Casual  Eye Contact:  Good  Speech:  Clear and Coherent and Normal Rate  Volume:  Normal  Mood:  Euthymic  Affect:  Appropriate and Congruent  Thought Process:  Coherent, Goal Directed, and Descriptions of  Associations: Intact  Orientation:  Full (Time, Place, and Person)  Thought Content: Logical   Suicidal Thoughts:  No  Homicidal Thoughts:  No  Memory:  Immediate;   Good Recent;   Good Remote;   Good  Judgement:  Intact  Insight:  Present  Psychomotor Activity:  Normal  Concentration:  Concentration: Good and Attention Span: Good  Recall:  Good  Fund of Knowledge: Good  Language: Good  Akathisia:  No  Handed:  Right  AIMS (if indicated): done  Assets:  Communication Skills Desire for Improvement Financial Resources/Insurance Housing Leisure Time Physical Health Resilience Social Support Transportation  ADL's:  Intact  Cognition: WNL  Sleep:  Good   Screenings: AIMS    Flowsheet Row Video Visit from 01/06/2024 in [facility] [place]  AIMS Total Score 0   GAD-7    Flowsheet Row Office Visit from 12/31/2023 in [facility] Office Visit from 11/11/2023 in [facility] [place] Office Visit from 07/19/2023 in [facility] Office Visit from 12/07/2022 in [facility] Office Visit from 05/25/2022 in [facility]  Total GAD-7 Score 0 7 3 5 1    PHQ2-9    Flowsheet Row Office Visit from 12/31/2023 in [facility] Office Visit from 11/11/2023 in [facility] [address] Office Visit from 07/19/2023 in [facility] Office Visit from 12/07/2022 in [facility] Office Visit from 05/25/2022 in [facility]  PHQ-2 Total Score 1 1 0 1 0  PHQ-9 Total Score 3 8 5 6 5    Flowsheet Row Office Visit from 11/11/2023 in [facility] [place]  C-SSRS RISK CATEGORY No Risk     Assessment and Plan:  Assessment: Summary of today's assessment: Shawn Matthews appears to be doing well.  He reports  current medications are effectively managing his mental health without any adverse reactions.  He does report that could be a little bit more improvement on concentration/focus.  He reports improvement in stable ability of mood, depression, anxiety, and sleep medication adjustment made.  He reports he is eating and sleeping without any difficulty.  He denies suicidal/self-harm/homicidal ideations, psychosis, paranoia, and abnormal movements. During visit he was dressed appropriate for age and weather.  He was seated comfortably in view of camera with  no noted distress.  He was alert/oriented x 4, calm/cooperative and mood congruent with affect.  He spoke in a clear tone at moderate volume, and normal pace, with good eye contact.  His thought process was coherent, relevant, and there was no indication that he was responding to internal/external stimuli or experiencing delusional thought content.  1. Insomnia, unspecified type - QUEtiapine  (SEROQUEL ) 100 MG tablet; Take 1 tablet (100 mg total) by mouth at bedtime.  Dispense: 90 tablet; Refill: 1  2. Attention deficit hyperactivity disorder (ADHD), unspecified ADHD type (Primary) - amphetamine -dextroamphetamine  (ADDERALL XR) 30 MG 24 hr capsule; Take 1 capsule (30 mg total) by mouth every morning.  Dispense: 30 capsule; Refill: 0 - amphetamine -dextroamphetamine  (ADDERALL XR) 30 MG 24 hr capsule; Take 1 capsule (30 mg total) by mouth every morning.  Dispense: 30 capsule; Refill: 0 - amphetamine -dextroamphetamine  (ADDERALL XR) 30 MG 24 hr capsule; Take 1 capsule (30 mg total) by mouth every morning.  Dispense: 30 capsule; Refill: 0  3. GAD (generalized anxiety disorder) - QUEtiapine  (SEROQUEL ) 100 MG tablet; Take 1 tablet (100 mg total) by mouth at bedtime.  Dispense: 90 tablet; Refill: 1  4. Bipolar 2 disorder (HCC) - QUEtiapine  (SEROQUEL ) 100 MG tablet; Take 1 tablet (100 mg total) by mouth at bedtime.  Dispense: 90 tablet; Refill: 1        Plan: Medication management: Meds ordered this encounter  Medications   amphetamine -dextroamphetamine  (ADDERALL XR) 30 MG 24 hr capsule    Sig: Take 1 capsule (30 mg total) by mouth every morning.    Dispense:  30 capsule    Refill:  0    Supervising Provider:   CURRY PATERSON T [[id number]]   DISCONTD: amphetamine -dextroamphetamine  (ADDERALL XR) 30 MG 24 hr capsule    Sig: Take 1 capsule (30 mg total) by mouth every morning.    Dispense:  30 capsule    Refill:  0    Supervising Provider:   CURRY PATERSON T [[id number]]   amphetamine -dextroamphetamine  (ADDERALL XR) 30 MG 24 hr capsule    Sig: Take 1 capsule (30 mg total) by mouth every morning.    Dispense:  30 capsule    Refill:  0    Supervising Provider:   CURRY, SYED T [[id number]]   QUEtiapine  (SEROQUEL ) 100 MG tablet    Sig: Take 1 tablet (100 mg total) by mouth at bedtime.    Dispense:  90 tablet    Refill:  1    Supervising Provider:   CURRY PATERSON T [[id number]]   DISCONTD: amphetamine -dextroamphetamine  (ADDERALL XR) 30 MG 24 hr capsule    Sig: Take 1 capsule (30 mg total) by mouth every morning.    Dispense:  30 capsule    Refill:  0    Supervising Provider:   CURRY PATERSON T [[id number]]   amphetamine -dextroamphetamine  (ADDERALL XR) 30 MG 24 hr capsule    Sig: Take 1 capsule (30 mg total) by mouth every morning.    Dispense:  30 capsule    Refill:  0    Supervising Provider:   CURRY PATERSON T [[id number]]   Medications Discontinued During This Encounter  Medication Reason   QUEtiapine  (SEROQUEL ) 100 MG tablet Reorder   amphetamine -dextroamphetamine  (ADDERALL XR) 25 MG 24 hr capsule    amphetamine -dextroamphetamine  (ADDERALL XR) 30 MG 24 hr capsule    amphetamine -dextroamphetamine  (ADDERALL XR) 30 MG 24 hr capsule     Labs:  Not indicated at this time.      Other:  Counseling/Therapy:  Declined.  Shawn Matthews was instructed to call 911, [phone], mobile crisis, or present to the nearest emergency room should he experiences any suicidal/homicidal  ideation, auditory/visual/hallucinations, or detrimental worsening of his mental health condition.   Shawn Matthews participated in the development of this treatment plan and verbalized his understanding/agreement with plan as listed.   Follow Up: Return in months for medication management Call in the interim for any side-effects, decompensation, questions, or problems  Collaboration of Care: Collaboration of Care: Medication Management AEB medication assessment, adjustment, and refill  Patient/Guardian was advised Release of Information must be obtained prior to any record release in order to collaborate their care with an outside provider. Patient/Guardian was advised if they have not already done so to contact the registration department to sign all necessary forms in order for us  to release information regarding their care.   Consent: Patient/Guardian gives verbal consent for treatment and assignment of benefits for services provided during this visit. Patient/Guardian expressed understanding and agreed to proceed.    Valeska Haislip, NP 01/06/2024, 3:14 PM

## 2024-01-06 NOTE — Patient Instructions (Signed)

## 2024-01-12 DIAGNOSIS — M79601 Pain in right arm: Secondary | ICD-10-CM | POA: Insufficient documentation

## 2024-01-12 NOTE — Assessment & Plan Note (Signed)
 Chronic right arm pain and swelling status post right humerus fracture repair with internal fixation Chronic pain and swelling likely due to previous fracture repair. Symptoms exacerbated by activity and possibly seasonal changes. Previous nerve test inconclusive. Gabapentin  effective previously. - Prescribe gabapentin  for nerve pain management. - Prescribe steroid pack for inflammation and swelling. - Send prescriptions to [facility] on [address]. - Provide work excuse for today and yesterday.

## 2024-01-21 ENCOUNTER — Ambulatory Visit

## 2024-01-21 VITALS — BP 131/82 | HR 83 | Ht 73.0 in | Wt 230.0 lb

## 2024-01-21 DIAGNOSIS — M79601 Pain in right arm: Secondary | ICD-10-CM

## 2024-01-21 DIAGNOSIS — K219 Gastro-esophageal reflux disease without esophagitis: Secondary | ICD-10-CM | POA: Diagnosis not present

## 2024-01-21 DIAGNOSIS — F909 Attention-deficit hyperactivity disorder, unspecified type: Secondary | ICD-10-CM | POA: Diagnosis not present

## 2024-01-21 DIAGNOSIS — Z79899 Other long term (current) drug therapy: Secondary | ICD-10-CM | POA: Diagnosis not present

## 2024-01-21 NOTE — Progress Notes (Signed)
   Established Patient Office Visit  Subjective   Patient ID: Shawn Matthews, male    DOB: 04-Feb-1996  Age: 28 y.o. MRN: [id number]  Chief Complaint  Patient presents with   Medical Management of Chronic Issues    6 month follow up     HPI  Patient Active Problem List   Diagnosis Date Noted   Long-term current use of drug therapy for attention deficit hyperactivity disorder (ADHD) 01/26/2024   Pain of right upper extremity 01/12/2024   Eczema 12/17/2022   Gallbladder polyp 12/17/2022   History of ADHD 05/25/2022   Insomnia 05/25/2022   GERD (gastroesophageal reflux disease) 05/25/2022   Encounter for general adult medical examination with abnormal findings 05/25/2022    ROS    Objective:     BP 131/82   Pulse 83   Ht 6' 1 (1.854 m)   Wt 230 lb (104.3 kg)   SpO2 99%   BMI 30.34 kg/m  BP Readings from Last 3 Encounters:  01/21/24 131/82  12/31/23 112/76  07/19/23 131/85   Wt Readings from Last 3 Encounters:  01/21/24 230 lb (104.3 kg)  12/31/23 230 lb (104.3 kg)  07/19/23 213 lb 3.2 oz (96.7 kg)      Physical Exam Vitals and nursing note reviewed.  Constitutional:      Appearance: Normal appearance.  HENT:     Head: Normocephalic.     Right Ear: Tympanic membrane, ear canal and external ear normal.     Left Ear: Tympanic membrane, ear canal and external ear normal.     Nose: Nose normal.     Mouth/Throat:     Mouth: Mucous membranes are moist.     Pharynx: Oropharynx is clear.  Eyes:     Extraocular Movements: Extraocular movements intact.     Pupils: Pupils are equal, round, and reactive to light.  Cardiovascular:     Rate and Rhythm: Normal rate and regular rhythm.  Pulmonary:     Effort: Pulmonary effort is normal.     Breath sounds: Normal breath sounds.  Musculoskeletal:     Cervical back: Normal range of motion and neck supple.  Skin:    General: Skin is warm and dry.  Neurological:     Mental Status: He is alert and oriented to person,  place, and time.  Psychiatric:        Mood and Affect: Mood normal.        Thought Content: Thought content normal.      No results found for any visits on 01/21/24.    The ASCVD Risk score (Arnett DK, et al., 2019) failed to calculate for the following reasons:   The 2019 ASCVD risk score is only valid for ages 52 to 22    Assessment & Plan:   Problem List Items Addressed This Visit       Digestive   GERD (gastroesophageal reflux disease) - Primary   Symptoms remain well-controlled with Pepcid .        Other   Long-term current use of drug therapy for attention deficit hyperactivity disorder (ADHD)   EKG maintained for baseline.  EKG shows NSR.  Also obtain BMP to assess kidney function.       Relevant Orders   EKG 12-Lead (Completed)   Basic Metabolic Panel (BMET)    Return in about 1 year (around 01/20/2025) for chronic follow-up with PCP.    Leita Longs, FNP

## 2024-01-26 DIAGNOSIS — Z79899 Other long term (current) drug therapy: Secondary | ICD-10-CM | POA: Insufficient documentation

## 2024-01-26 NOTE — Assessment & Plan Note (Signed)
Symptoms remain well-controlled with Pepcid.

## 2024-01-26 NOTE — Assessment & Plan Note (Signed)
 EKG maintained for baseline.  EKG shows NSR.  Also obtain BMP to assess kidney function.

## 2024-04-06 ENCOUNTER — Encounter (HOSPITAL_COMMUNITY): Payer: Self-pay | Admitting: Registered Nurse

## 2024-04-06 ENCOUNTER — Telehealth (HOSPITAL_COMMUNITY): Admitting: Registered Nurse

## 2024-04-06 DIAGNOSIS — F411 Generalized anxiety disorder: Secondary | ICD-10-CM

## 2024-04-06 DIAGNOSIS — F909 Attention-deficit hyperactivity disorder, unspecified type: Secondary | ICD-10-CM | POA: Diagnosis not present

## 2024-04-06 DIAGNOSIS — G4700 Insomnia, unspecified: Secondary | ICD-10-CM

## 2024-04-06 DIAGNOSIS — F3181 Bipolar II disorder: Secondary | ICD-10-CM | POA: Diagnosis not present

## 2024-04-06 MED ORDER — QUETIAPINE FUMARATE 100 MG PO TABS: 100.0000 mg | ORAL_TABLET | Freq: Every day | ORAL | 1 refills | Status: AC

## 2024-04-06 MED ORDER — AMPHETAMINE-DEXTROAMPHET ER 30 MG PO CP24: 30.0000 mg | ORAL_CAPSULE | ORAL | 0 refills | Status: AC

## 2024-04-06 NOTE — Progress Notes (Signed)
 [facility] MD/PA/NP OP Progress Note  04/06/2024 2:21 PM Shawn Matthews  MRN:  [id number]  Virtual Visit via Video Note  I connected with Shawn Matthews on 04/06/24 at  2:00 PM EST by a video enabled telemedicine application and verified that I am speaking with the correct person using two identifiers.  Location: Patient: Parked car Provider: Davene GLAD, Grant   I discussed the limitations of evaluation and management by telemedicine and the availability of in person appointments. The patient expressed understanding and agreed to proceed.  I discussed the assessment and treatment plan with the patient. The patient was provided an opportunity to ask questions and all were answered. The patient agreed with the plan and demonstrated an understanding of the instructions.   The patient was advised to call back or seek an in-person evaluation if the symptoms worsen or if the condition fails to improve as anticipated.  I provided 20 minutes of non-face-to-face time during this encounter.  I personally spent a total of 20 minutes in the care of the patient today including preparing to see the patient, getting/reviewing separately obtained history, performing a medically appropriate exam/evaluation, counseling and educating, placing orders, referring and communicating with other health care professionals, and documenting clinical information in the EHR in addiction to conducting screenings PHQ-9, C-SSRS, GAD-7, AIMS, Nutrition, and Pain, discussing medication options, medication education, and discussing safety.   Shawn Ruder, NP   Chief Complaint:  Chief Complaint  Patient presents with   Follow-up    Medication management   HPI: Shawn Matthews 29 y.o. male presents today for medication management follow up.  He was seen via virtual video visit by this provide and chart reviewed on 04/06/24.  His psychiatric history is significant for ADD, ADHD, Bipolar 2 disorder, major depression, and anxiety.  His  mental health is currently managed with Seroquel  100 mg nightly and Adderall XR 30 mg daily.  He reported current medication regimen is effectively managing his mental health without any adverse reaction.  He reported eating and sleeping without difficulty.  He denies suicidal/self-harm/homicidal ideation, psychosis, paranoia, and abnormal movement. Screenings completed during today's visit PHQ-9, C-SSRS, GAD-7, AIMS, Nutrition, and Pain, see scores below.    Recommendations: Continue Adderall XR 30 mg daily and Seroquel  100 mg nightly He voiced understanding and agreement with today's plan and recommendations.  Visit Diagnosis:    ICD-10-CM   1. Bipolar 2 disorder (HCC)  F31.81 QUEtiapine  (SEROQUEL ) 100 MG tablet    2. Attention deficit hyperactivity disorder (ADHD), unspecified ADHD type  F90.9 amphetamine -dextroamphetamine  (ADDERALL XR) 30 MG 24 hr capsule    amphetamine -dextroamphetamine  (ADDERALL XR) 30 MG 24 hr capsule    amphetamine -dextroamphetamine  (ADDERALL XR) 30 MG 24 hr capsule    3. Insomnia, unspecified type  G47.00 QUEtiapine  (SEROQUEL ) 100 MG tablet    4. GAD (generalized anxiety disorder)  F41.1 QUEtiapine  (SEROQUEL ) 100 MG tablet      Past Psychiatric History:  Diagnosis: ADD, Bipolar 2, general anxiety Suicide attempt:  Denies Non-suicidal self-injurious behavior:  Denies Psychiatric hospitalization:  Denies Past trauma/neglect/abuse:  Reports a history of pretty bad child hood. Both parents were drug addicts and alcoholics. Substance abuse:  Reports a history of Just about any party drug marijuana, cocaine ecstasy, prescribed pills during teenage and early college years.  Report last use of all illicit drugs except marijuana was 7 yr ago.  Reports daily use of concentrated CBD oil (smokes) daily after work, it helps me to calm down and relax.  Past psychotropic medication trials:  Wellbutrin (stopped after manic episode and GI problems).  Currently taking  Adderall XR and Seroquel  for the last 3-4 years.     Past Medical History:  Past Medical History:  Diagnosis Date   ADHD (attention deficit hyperactivity disorder)    Anxiety    Bipolar disorder (HCC)     Past Surgical History:  Procedure Laterality Date   arm surgery Right    CIRCUMCISION     TONSILLECTOMY      Family Psychiatric History: See below in family history. Reports a significant family history of mental illness on maternal sided of family. States his biological mother, aunt, grand mother and mother all had mental illness and biological grandmother killed her self. He states that his mother and her twin sister were adopted and family history is what was told to him by his mother.   Family History:  Family History  Problem Relation Age of Onset   Drug abuse Mother    Depression Mother    Bipolar disorder Mother    Anxiety disorder Mother    Alcohol abuse Mother    Schizophrenia Sister    Mental illness Maternal Aunt    Mental illness Maternal Grandmother     Social History:  Social History   Socioeconomic History   Marital status: Married    Spouse name: Not on file   Number of children: 0   Years of education: college   Highest education level: Bachelor's degree (e.g., BA, AB, BS)  Occupational History   Not on file  Tobacco Use   Smoking status: Some Days    Current packs/day: 0.50    Types: Cigarettes   Smokeless tobacco: Never  Vaping Use   Vaping status: Never Used  Substance and Sexual Activity   Alcohol use: No   Drug use: Yes    Types: Marijuana   Sexual activity: Yes    Birth control/protection: None  Other Topics Concern   Not on file  Social History Narrative   Not on file   Social Drivers of Health   Tobacco Use: High Risk (04/06/2024)   Patient History    Smoking Tobacco Use: Some Days    Smokeless Tobacco Use: Never    Passive Exposure: Not on file  Financial Resource Strain: Not on file  Food Insecurity: Not on file   Transportation Needs: Not on file  Physical Activity: Not on file  Stress: Not on file  Social Connections: Not on file  Depression (PHQ2-9): Low Risk (04/06/2024)   Depression (PHQ2-9)    PHQ-2 Score: 0  Alcohol Screen: Not on file  Housing: Not on file  Utilities: Not on file  Health Literacy: Not on file    Allergies:  Allergies  Allergen Reactions   Other     Bees    Metabolic Disorder Labs: Lab Results  Component Value Date   HGBA1C 5.3 07/19/2023   No results found for: PROLACTIN Lab Results  Component Value Date   CHOL 168 07/19/2023   TRIG 144 07/19/2023   HDL 51 07/19/2023   CHOLHDL 3.3 07/19/2023   LDLCALC 92 07/19/2023   LDLCALC 130 (H) 05/25/2022   Lab Results  Component Value Date   TSH 2.460 07/19/2023   TSH 1.140 05/25/2022    Current Medications: Current Outpatient Medications  Medication Sig Dispense Refill   [START ON 06/29/2024] amphetamine -dextroamphetamine  (ADDERALL XR) 30 MG 24 hr capsule Take 1 capsule (30 mg total) by mouth every morning. 30 capsule 0   [  START ON 05/29/2024] amphetamine -dextroamphetamine  (ADDERALL XR) 30 MG 24 hr capsule Take 1 capsule (30 mg total) by mouth every morning. 30 capsule 0   [START ON 05/01/2024] amphetamine -dextroamphetamine  (ADDERALL XR) 30 MG 24 hr capsule Take 1 capsule (30 mg total) by mouth every morning. 30 capsule 0   famotidine  (PEPCID ) 40 MG tablet TAKE 1 TABLET BY MOUTH TWICE A DAY 180 tablet 1   gabapentin  (NEURONTIN ) 100 MG capsule Take 1 capsule (100 mg total) by mouth 3 (three) times daily. 90 capsule 3   QUEtiapine  (SEROQUEL ) 100 MG tablet Take 1 tablet (100 mg total) by mouth at bedtime. 90 tablet 1   triamcinolone  cream (KENALOG ) 0.1 % Apply 1 Application topically daily as needed (eczema flare). 30 g 1   No current facility-administered medications for this visit.     Musculoskeletal: Strength & Muscle Tone: Unable to assess via virtual visit Gait & Station: Unable to assess via virtual  visit Patient leans: N/A  Psychiatric Specialty Exam: Review of Systems  Constitutional:        No other complaints voiced at this time  Psychiatric/Behavioral:  Negative for hallucinations and self-injury. Agitation: Reported stable mood. Decreased concentration: Reported stable. Dysphoric mood: Reported stable. Sleep disturbance: Reports stable.The patient is not hyperactive. Nervous/anxious: Reported stable.  All other systems reviewed and are negative.   There were no vitals taken for this visit.There is no height or weight on file to calculate BMI.  General Appearance: Casual  Eye Contact:  Good  Speech:  Clear and Coherent and Normal Rate  Volume:  Normal  Mood:  Euthymic good  Affect:  Appropriate and Congruent  Thought Process:  Coherent, Goal Directed, and Descriptions of Associations: Intact  Orientation:  Full (Time, Place, and Person)  Thought Content: Logical   Suicidal Thoughts:  No  Homicidal Thoughts:  No  Memory:  Immediate;   Good Recent;   Good Remote;   Good  Judgement:  Intact  Insight:  Present  Psychomotor Activity:  Normal  Concentration:  Concentration: Good and Attention Span: Good  Recall:  Good  Fund of Knowledge: Good  Language: Good  Akathisia:  No  Handed:  Right  AIMS (if indicated): done  Assets:  Communication Skills Desire for Improvement Financial Resources/Insurance Housing Leisure Time Physical Health Resilience Social Support Transportation  ADL's:  Intact  Cognition: WNL  Sleep:  Good   Screenings: AIMS    Flowsheet Row Video Visit from 04/06/2024 in [facility] [place] Video Visit from 01/06/2024 in [facility] [place]  AIMS Total Score 0 0   GAD-7    Flowsheet Row Video Visit from 04/06/2024 in [facility] [place] Office Visit from 01/21/2024 in [facility] Office Visit from 12/31/2023 in [facility] Office Visit from 11/11/2023 in [facility] at [place] Office Visit from 07/19/2023 in [facility]  Total GAD-7 Score 2 0 0 7 3   PHQ2-9    Flowsheet Row Video Visit from 04/06/2024 in [facility] [place] Office Visit from 01/21/2024 in [facility] Office Visit from 12/31/2023 in [facility] Office Visit from 11/11/2023 in [facility] at [place] Office Visit from 07/19/2023 in [facility]  PHQ-2 Total Score 0 1 1 1  0  PHQ-9 Total Score -- 3 3 8  5  Flowsheet Row Video Visit from 04/06/2024 in [facility] at [place] Office Visit from 11/11/2023 in [facility] at [place]  C-SSRS RISK CATEGORY No Risk No Risk   Assessment and Plan:  Assessment: Summary of today's assessment: Shawn Matthews reported current medication is effective in managing his mental health without any adverse reaction, eating/sleeping without difficulty, and denies suicidal/self-harm/homicidal ideation, psychosis, paranoia, and abnormal movement.   During visit Shawn Matthews was dressed appropriately for age and current weather.  He was seated comfortably in view of camera with no noted distress.  He was alert, oriented x 4, calm, cooperative, and attentive.  His mood was congruent with affect.  He had normal speech and behavior.  Objectively there was no evidence of psychosis, mania, or delusional thinking.  He  was able to converse coherently and responded appropriately with goal directed thoughts, no distractibility, or pre-occupation.  1. Attention deficit hyperactivity disorder (ADHD), unspecified ADHD type - amphetamine -dextroamphetamine  (ADDERALL XR) 30 MG 24 hr capsule; Take 1 capsule (30 mg total) by mouth every morning.  Dispense: 30 capsule;  Refill: 0 - amphetamine -dextroamphetamine  (ADDERALL XR) 30 MG 24 hr capsule; Take 1 capsule (30 mg total) by mouth every morning.  Dispense: 30 capsule; Refill: 0 - amphetamine -dextroamphetamine  (ADDERALL XR) 30 MG 24 hr capsule; Take 1 capsule (30 mg total) by mouth every morning.  Dispense: 30 capsule; Refill: 0  2. Insomnia, unspecified type - QUEtiapine  (SEROQUEL ) 100 MG tablet; Take 1 tablet (100 mg total) by mouth at bedtime.  Dispense: 90 tablet; Refill: 1  3. GAD (generalized anxiety disorder) - QUEtiapine  (SEROQUEL ) 100 MG tablet; Take 1 tablet (100 mg total) by mouth at bedtime.  Dispense: 90 tablet; Refill: 1  4. Bipolar 2 disorder (HCC) (Primary) - QUEtiapine  (SEROQUEL ) 100 MG tablet; Take 1 tablet (100 mg total) by mouth at bedtime.  Dispense: 90 tablet; Refill: 1      Plan: Medication management: Meds ordered this encounter  Medications   amphetamine -dextroamphetamine  (ADDERALL XR) 30 MG 24 hr capsule    Sig: Take 1 capsule (30 mg total) by mouth every morning.    Dispense:  30 capsule    Refill:  0    Supervising Provider:   CURRY PATERSON Matthews [[id number]]   amphetamine -dextroamphetamine  (ADDERALL XR) 30 MG 24 hr capsule    Sig: Take 1 capsule (30 mg total) by mouth every morning.    Dispense:  30 capsule    Refill:  0    Supervising Provider:   CURRY, Shawn Matthews [[id number]]   amphetamine -dextroamphetamine  (ADDERALL XR) 30 MG 24 hr capsule    Sig: Take 1 capsule (30 mg total) by mouth every morning.    Dispense:  30 capsule    Refill:  0    Supervising Provider:   CURRY PATERSON Matthews [[id number]]   QUEtiapine  (SEROQUEL ) 100 MG tablet    Sig: Take 1 tablet (100 mg total) by mouth at bedtime.    Dispense:  90 tablet    Refill:  1    Supervising Provider:   ARFEEN, Shawn Matthews [[id number]]   Medications Discontinued During This Encounter  Medication Reason   amphetamine -dextroamphetamine  (ADDERALL XR) 30 MG 24 hr capsule Reorder   amphetamine -dextroamphetamine  (ADDERALL XR) 30 MG 24 hr capsule Reorder    QUEtiapine  (SEROQUEL ) 100 MG tablet Reorder   amphetamine -dextroamphetamine  (ADDERALL XR) 30 MG 24 hr capsule Reorder     Labs:  Not indicated at this time.      Other:  Counseling/Therapy:  Declined services.   Shawn Matthews was instructed to call 911, [phone], mobile crisis, or present to the nearest emergency room should he experiences any suicidal/homicidal ideation, auditory/visual/hallucinations, or detrimental worsening of his mental health condition.   Shawn Matthews participated in the development of this treatment plan and verbalized his understanding/agreement with plan as listed.   Follow Up: Return in 3 months for medication management Call in the interim for any side-effects, decompensation, questions, or problems  Collaboration of Care: Collaboration of Care: Medication Management AEB medication assessment and refills.  Patient/Guardian was advised Release of Information must be obtained prior to any record release in order to collaborate their care with an outside provider. Patient/Guardian was advised if they have not already done so to contact the registration department to sign all necessary forms in order for us  to release information regarding their care.   Consent: Patient/Guardian gives verbal consent for treatment and assignment of benefits for services provided during this visit. Patient/Guardian expressed understanding and agreed to proceed.    Skyelynn Rambeau, NP 04/06/2024, 2:21 PM

## 2024-04-06 NOTE — Patient Instructions (Signed)

## 2025-01-22 ENCOUNTER — Encounter
